# Patient Record
Sex: Female | Born: 1935 | Race: White | Hispanic: No | Marital: Married | Smoking: Former smoker
Health system: Southern US, Community
[De-identification: ages and names within clinical notes are randomized; demographics above are authoritative.]

## PROBLEM LIST (undated history)

## (undated) DIAGNOSIS — M353 Polymyalgia rheumatica: Secondary | ICD-10-CM

## (undated) DIAGNOSIS — I700 Atherosclerosis of aorta: Secondary | ICD-10-CM

## (undated) DIAGNOSIS — Q7649 Other congenital malformations of spine, not associated with scoliosis: Secondary | ICD-10-CM

## (undated) DIAGNOSIS — Z8619 Personal history of other infectious and parasitic diseases: Secondary | ICD-10-CM

## (undated) DIAGNOSIS — I10 Essential (primary) hypertension: Secondary | ICD-10-CM

## (undated) HISTORY — DX: Personal history of other infectious and parasitic diseases: Z86.19

## (undated) HISTORY — DX: Essential (primary) hypertension: I10

## (undated) HISTORY — PX: CARPAL TUNNEL RELEASE: SHX101

## (undated) HISTORY — DX: Atherosclerosis of aorta: I70.0

## (undated) HISTORY — DX: Polymyalgia rheumatica: M35.3

---

## 1988-09-01 HISTORY — PX: BREAST BIOPSY: SHX20

## 2016-09-25 ENCOUNTER — Ambulatory Visit: Payer: Medicare Other | Admitting: Internal Medicine

## 2016-09-30 ENCOUNTER — Ambulatory Visit: Payer: Medicare Other | Admitting: Internal Medicine

## 2016-10-14 ENCOUNTER — Encounter: Payer: Self-pay | Admitting: Internal Medicine

## 2016-10-14 ENCOUNTER — Ambulatory Visit (INDEPENDENT_AMBULATORY_CARE_PROVIDER_SITE_OTHER): Payer: Medicare Other | Admitting: Internal Medicine

## 2016-10-14 VITALS — BP 132/80 | HR 72 | Temp 97.8°F | Ht 62.0 in | Wt 129.8 lb

## 2016-10-14 DIAGNOSIS — M353 Polymyalgia rheumatica: Secondary | ICD-10-CM | POA: Insufficient documentation

## 2016-10-14 DIAGNOSIS — I10 Essential (primary) hypertension: Secondary | ICD-10-CM

## 2016-10-14 MED ORDER — PREDNISONE 2.5 MG PO TABS: 2.5000 mg | ORAL_TABLET | Freq: Every day | ORAL | 1 refills | Status: DC

## 2016-10-14 NOTE — Progress Notes (Signed)
HPI  Pt presents to the clinic today to establish care and for management of the conditions listed below. She is transferring care from Dr. Alfonse Spruce in [place].  HTN: Her BP today is 132/80. She has been on blood pressure medication in the past during a period of grieving, but she has not taken medication recently for this.  PMR: She denies any pain at this time. She takes Prednisone daily. She had her labs checked 08/08/17. She needs referral for a rheumatologist in the area.  Flu: 05/2016 Tetanus: unsure Pneumovax: 2015 Prevnar: 2016 Zostavax: never Pap Smear: < 5 years Mammogram: 06/2016 Bone Density Exam: about 2 years ago Colon Screening: 2012 Vision Screening: annually Dentist: biannually  Past Medical History:  Diagnosis Date  . History of shingles   . Hypertension   . PMR (polymyalgia rheumatica) (HCC)     No current outpatient prescriptions on file.   No current facility-administered medications for this visit.     No Known Allergies  Family History  Problem Relation Age of Onset  . Lung cancer Mother   . Hypertension Mother   . Throat cancer Father     Social History   Social History  . Marital status: Married    Spouse name: N/A  . Number of children: N/A  . Years of education: N/A   Occupational History  . Not on file.   Social History Main Topics  . Smoking status: Never Smoker  . Smokeless tobacco: Never Used  . Alcohol use Yes     Comment: daily--wine  . Drug use: No  . Sexual activity: Not on file   Other Topics Concern  . Not on file   Social History Narrative  . No narrative on file    ROS:  Constitutional: Denies fever, malaise, fatigue, headache or abrupt weight changes.  HEENT: Denies eye pain, eye redness, ear pain, ringing in the ears, wax buildup, runny nose, nasal congestion, bloody nose, or sore throat. Respiratory: Denies difficulty breathing, shortness of breath, cough or sputum production.   Cardiovascular: Denies chest pain,  chest tightness, palpitations or swelling in the hands or feet.  Gastrointestinal: Denies abdominal pain, bloating, constipation, diarrhea or blood in the stool.  GU: Denies frequency, urgency, pain with urination, blood in urine, odor or discharge. Musculoskeletal: Denies decrease in range of motion, difficulty with gait, muscle pain or joint pain and swelling.  Skin: Denies redness, rashes, lesions or ulcercations.  Neurological: Denies dizziness, difficulty with memory, difficulty with speech or problems with balance and coordination.  Psych: Denies anxiety, depression, SI/HI.  No other specific complaints in a complete review of systems (except as listed in HPI above).  PE:  BP 132/80   Pulse 72   Temp 97.8 F (36.6 C) (Oral)   Ht 5\' 2"  (1.575 m)   Wt 129 lb 12 oz (58.9 kg)   SpO2 98%   BMI 23.73 kg/m  Wt Readings from Last 3 Encounters:  10/14/16 129 lb 12 oz (58.9 kg)    General: Appears her stated age, well developed, well nourished in NAD. Cardiovascular: Normal rate and rhythm. S1,S2 noted.  No murmur, rubs or gallops noted.  Pulmonary/Chest: Normal effort and positive vesicular breath sounds. No respiratory distress. No wheezes, rales or ronchi noted.  Musculoskeletal: No signs of joint swelling. No difficulty with gait.  Neurological: Alert and oriented. Psychiatric: Mood and affect normal. Behavior is normal. Judgment and thought content normal.   Assessment and Plan:

## 2016-10-14 NOTE — Assessment & Plan Note (Signed)
Controlled on Prednisone 2.5 mg tablets- refilled today Referral placed to rheumatology- see Rosaria Ferries on the way out to schedule

## 2016-10-14 NOTE — Patient Instructions (Signed)
Polymyalgia Rheumatica Introduction Polymyalgia rheumatica (PMR) is an inflammatory disorder that causes aching and stiffness in your muscles and joints. Sometimes, PMR leads to a more dangerous condition (temporal arteritis or giant cell arteritis), which can cause vision loss. What are the causes? The exact cause of PMR is not known. What increases the risk? This condition is more likely to develop in:  Females.  People who are 80 years of age or older.  Caucasians. What are the signs or symptoms?   Pain and stiffness are the main symptoms of PMR. Symptoms may start slowly or suddenly. The symptoms:  May be worse after inactivity and in the morning.  May affect your:  Hips, buttocks, and thighs.  Neck, arms, and shoulders. This can make it hard to raise your arms above your head.  Hands and wrists. Other symptoms include:  Fever.  Tiredness.  Weakness.  Decreased appetite. This may lead to weight loss. How is this diagnosed? This condition is diagnosed with a medical history and physical exam. You may need to see a health care provider who specializes in diseases of the joint, muscles, and bones (rheumatologist). You may also have tests, including:  Blood tests.  X-rays. How is this treated? PMR usually goes away without treatment, but it may take years for that to happen. In the meantime, your health care provider may recommend low-dose steroids to help manage your symptoms of pain and stiffness. Regular exercise and rest will also help your symptoms. Follow these instructions at home:  Take over-the-counter and prescription medicines only as told by your health care provider.  Make sure to get enough rest and sleep.  Eat a healthy and nutritious diet.  Try to exercise most days of the week. Ask your health care provider what type of exercise is best for you.  Keep all follow-up visits as told by your health are provider. This is important. Contact a health  care provider if:  Your symptoms are not controlled with medicine.  You have side effects from steroids. These may include:  Weight gain.  Swelling.  Insomnia.  Mood changes.  Bruising.  High blood sugar readings, if you have diabetes.  Higher than normal blood pressure readings, if you monitor your blood pressure. Get help right away if:  You develop symptoms of temporal arteritis, such as:  A change in vision.  Severe headache.  Scalp pain.  Jaw pain. This information is not intended to replace advice given to you by your health care provider. Make sure you discuss any questions you have with your health care provider. Document Released: 09/25/2004 Document Revised: 01/24/2016 Document Reviewed: 02/28/2015  2017 Elsevier

## 2016-10-14 NOTE — Assessment & Plan Note (Signed)
Discussed BP goal of 120/80. She feels like her BP today is good, and is not interested in starting any medication for this at this time Will monitor

## 2016-10-22 DIAGNOSIS — M8580 Other specified disorders of bone density and structure, unspecified site: Secondary | ICD-10-CM | POA: Insufficient documentation

## 2016-10-22 DIAGNOSIS — M353 Polymyalgia rheumatica: Secondary | ICD-10-CM | POA: Diagnosis not present

## 2016-10-22 DIAGNOSIS — M8588 Other specified disorders of bone density and structure, other site: Secondary | ICD-10-CM | POA: Diagnosis not present

## 2016-10-22 DIAGNOSIS — Z7952 Long term (current) use of systemic steroids: Secondary | ICD-10-CM | POA: Diagnosis not present

## 2016-10-22 DIAGNOSIS — I10 Essential (primary) hypertension: Secondary | ICD-10-CM | POA: Insufficient documentation

## 2016-12-09 ENCOUNTER — Ambulatory Visit (INDEPENDENT_AMBULATORY_CARE_PROVIDER_SITE_OTHER): Payer: Medicare Other | Admitting: Internal Medicine

## 2016-12-09 ENCOUNTER — Encounter: Payer: Self-pay | Admitting: Internal Medicine

## 2016-12-09 VITALS — BP 132/76 | HR 79 | Temp 97.9°F | Wt 134.5 lb

## 2016-12-09 DIAGNOSIS — J301 Allergic rhinitis due to pollen: Secondary | ICD-10-CM | POA: Diagnosis not present

## 2016-12-09 NOTE — Progress Notes (Signed)
HPI  Pt presents to the clinic today with c/o nasal congestion and cough. This started 1 week ago. She is not able to blow much out of her nose. The cough is productive at times of yellow/clear mucous. She denies ear pain, runny nose or sore throat. She denies fever, chills or body aches. She has tried Robitussin and Administrator with minimal relief. She has no history of seasonal allergies. She has not had sick contacts.  Review of Systems     Past Medical History:  Diagnosis Date  . History of shingles   . Hypertension   . PMR (polymyalgia rheumatica) (HCC)     Family History  Problem Relation Age of Onset  . Lung cancer Mother   . Hypertension Mother   . Throat cancer Father   . Diabetes Neg Hx   . Stroke Neg Hx     Social History   Social History  . Marital status: Married    Spouse name: N/A  . Number of children: N/A  . Years of education: N/A   Occupational History  . Not on file.   Social History Main Topics  . Smoking status: Former Research scientist (life sciences)  . Smokeless tobacco: Never Used  . Alcohol use Yes     Comment: daily--wine  . Drug use: No  . Sexual activity: Yes   Other Topics Concern  . Not on file   Social History Narrative  . No narrative on file    No Known Allergies   Constitutional:  Denies headache, fatigue, fever or abrupt weight changes.  HEENT:  Positive nasal congestion. Denies eye redness, ear pain, ringing in the ears, wax buildup, runny nose or sore throat. Respiratory: Positive cough. Denies difficulty breathing or shortness of breath.  Cardiovascular: Denies chest pain, chest tightness, palpitations or swelling in the hands or feet.   No other specific complaints in a complete review of systems (except as listed in HPI above).  Objective:   BP 132/76   Pulse 79   Temp 97.9 F (36.6 C) (Oral)   Wt 134 lb 8 oz (61 kg)   SpO2 98%   BMI 24.60 kg/m   General: Appears her stated age, well developed, well nourished in NAD. HEENT: Head:  normal shape and size, no sinus tenderness noted; Ears: Tm's gray and intact, normal light reflex; Nose: mucosa boggy and moist, turbinates swollen L>R, septum midline; Throat/Mouth: + PND. Teeth present, mucosa pink and moist, no exudate noted, no lesions or ulcerations noted.  Neck:  No adenopathy noted.  Pulmonary/Chest: Normal effort and positive vesicular breath sounds. No respiratory distress. No wheezes, rales or ronchi noted.       Assessment & Plan:   Allergic Rhinitis:  Start Zyrtec OTC Flonase 2 sprays each nostril for 3 days and then as needed.   RTC as needed or if symptoms persist. Webb Silversmith, NP

## 2016-12-09 NOTE — Patient Instructions (Signed)
Allergic Rhinitis Allergic rhinitis is when the mucous membranes in the nose respond to allergens. Allergens are particles in the air that cause your body to have an allergic reaction. This causes you to release allergic antibodies. Through a chain of events, these eventually cause you to release histamine into the blood stream. Although meant to protect the body, it is this release of histamine that causes your discomfort, such as frequent sneezing, congestion, and an itchy, runny nose. What are the causes? Seasonal allergic rhinitis (hay fever) is caused by pollen allergens that may come from grasses, trees, and weeds. Year-round allergic rhinitis (perennial allergic rhinitis) is caused by allergens such as house dust mites, pet dander, and mold spores. What are the signs or symptoms?  Nasal stuffiness (congestion).  Itchy, runny nose with sneezing and tearing of the eyes. How is this diagnosed? Your health care provider can help you determine the allergen or allergens that trigger your symptoms. If you and your health care provider are unable to determine the allergen, skin or blood testing may be used. Your health care provider will diagnose your condition after taking your health history and performing a physical exam. Your health care provider may assess you for other related conditions, such as asthma, pink eye, or an ear infection. How is this treated? Allergic rhinitis does not have a cure, but it can be controlled by:  Medicines that block allergy symptoms. These may include allergy shots, nasal sprays, and oral antihistamines.  Avoiding the allergen. Hay fever may often be treated with antihistamines in pill or nasal spray forms. Antihistamines block the effects of histamine. There are over-the-counter medicines that may help with nasal congestion and swelling around the eyes. Check with your health care provider before taking or giving this medicine. If avoiding the allergen or the  medicine prescribed do not work, there are many new medicines your health care provider can prescribe. Stronger medicine may be used if initial measures are ineffective. Desensitizing injections can be used if medicine and avoidance does not work. Desensitization is when a patient is given ongoing shots until the body becomes less sensitive to the allergen. Make sure you follow up with your health care provider if problems continue. Follow these instructions at home: It is not possible to completely avoid allergens, but you can reduce your symptoms by taking steps to limit your exposure to them. It helps to know exactly what you are allergic to so that you can avoid your specific triggers. Contact a health care provider if:  You have a fever.  You develop a cough that does not stop easily (persistent).  You have shortness of breath.  You start wheezing.  Symptoms interfere with normal daily activities. This information is not intended to replace advice given to you by your health care provider. Make sure you discuss any questions you have with your health care provider. Document Released: 05/13/2001 Document Revised: 04/18/2016 Document Reviewed: 04/25/2013 Elsevier Interactive Patient Education  2017 Elsevier Inc.  

## 2017-01-15 DIAGNOSIS — M353 Polymyalgia rheumatica: Secondary | ICD-10-CM | POA: Diagnosis not present

## 2017-01-28 DIAGNOSIS — Z7952 Long term (current) use of systemic steroids: Secondary | ICD-10-CM | POA: Diagnosis not present

## 2017-01-28 DIAGNOSIS — M353 Polymyalgia rheumatica: Secondary | ICD-10-CM | POA: Diagnosis not present

## 2017-01-28 DIAGNOSIS — M8580 Other specified disorders of bone density and structure, unspecified site: Secondary | ICD-10-CM | POA: Diagnosis not present

## 2017-03-26 ENCOUNTER — Ambulatory Visit: Payer: Medicare Other | Admitting: Internal Medicine

## 2017-03-31 DIAGNOSIS — M8588 Other specified disorders of bone density and structure, other site: Secondary | ICD-10-CM | POA: Diagnosis not present

## 2017-04-30 DIAGNOSIS — Z7952 Long term (current) use of systemic steroids: Secondary | ICD-10-CM | POA: Diagnosis not present

## 2017-04-30 DIAGNOSIS — M8580 Other specified disorders of bone density and structure, unspecified site: Secondary | ICD-10-CM | POA: Diagnosis not present

## 2017-04-30 DIAGNOSIS — M353 Polymyalgia rheumatica: Secondary | ICD-10-CM | POA: Diagnosis not present

## 2017-10-29 DIAGNOSIS — M8580 Other specified disorders of bone density and structure, unspecified site: Secondary | ICD-10-CM | POA: Diagnosis not present

## 2017-10-29 DIAGNOSIS — M353 Polymyalgia rheumatica: Secondary | ICD-10-CM | POA: Diagnosis not present

## 2018-01-04 DIAGNOSIS — M8580 Other specified disorders of bone density and structure, unspecified site: Secondary | ICD-10-CM | POA: Diagnosis not present

## 2018-01-04 DIAGNOSIS — M791 Myalgia, unspecified site: Secondary | ICD-10-CM | POA: Diagnosis not present

## 2018-01-06 ENCOUNTER — Encounter: Payer: Self-pay | Admitting: Internal Medicine

## 2018-01-06 ENCOUNTER — Ambulatory Visit (INDEPENDENT_AMBULATORY_CARE_PROVIDER_SITE_OTHER): Payer: Medicare HMO | Admitting: Internal Medicine

## 2018-01-06 VITALS — BP 128/80 | HR 81 | Temp 97.9°F | Ht 61.66 in | Wt 135.0 lb

## 2018-01-06 DIAGNOSIS — I10 Essential (primary) hypertension: Secondary | ICD-10-CM

## 2018-01-06 DIAGNOSIS — Z0000 Encounter for general adult medical examination without abnormal findings: Secondary | ICD-10-CM

## 2018-01-06 DIAGNOSIS — M353 Polymyalgia rheumatica: Secondary | ICD-10-CM

## 2018-01-06 NOTE — Assessment & Plan Note (Signed)
Controlled off meds [facility] today Reinforced DASH diet

## 2018-01-06 NOTE — Assessment & Plan Note (Signed)
Working with rheumatology on workup Continue Prednisone per rheumatology

## 2018-01-06 NOTE — Patient Instructions (Signed)
Health Maintenance for Postmenopausal Women Menopause is a normal process in which your reproductive ability comes to an end. This process happens gradually over a span of months to years, usually between the ages of 22 and 9. Menopause is complete when you have missed 12 consecutive menstrual periods. It is important to talk with your health care provider about some of the most common conditions that affect postmenopausal women, such as heart disease, cancer, and bone loss (osteoporosis). Adopting a healthy lifestyle and getting preventive care can help to promote your health and wellness. Those actions can also lower your chances of developing some of these common conditions. What should I know about menopause? During menopause, you may experience a number of symptoms, such as:  Moderate-to-severe hot flashes.  Night sweats.  Decrease in sex drive.  Mood swings.  Headaches.  Tiredness.  Irritability.  Memory problems.  Insomnia.  Choosing to treat or not to treat menopausal changes is an individual decision that you make with your health care provider. What should I know about hormone replacement therapy and supplements? Hormone therapy products are effective for treating symptoms that are associated with menopause, such as hot flashes and night sweats. Hormone replacement carries certain risks, especially as you become older. If you are thinking about using estrogen or estrogen with progestin treatments, discuss the benefits and risks with your health care provider. What should I know about heart disease and stroke? Heart disease, heart attack, and stroke become more likely as you age. This may be due, in part, to the hormonal changes that your body experiences during menopause. These can affect how your body processes dietary fats, triglycerides, and cholesterol. Heart attack and stroke are both medical emergencies. There are many things that you can do to help prevent heart disease  and stroke:  Have your blood pressure checked at least every 1-2 years. High blood pressure causes heart disease and increases the risk of stroke.  If you are 53-22 years old, ask your health care provider if you should take aspirin to prevent a heart attack or a stroke.  Do not use any tobacco products, including cigarettes, chewing tobacco, or electronic cigarettes. If you need help quitting, ask your health care provider.  It is important to eat a healthy diet and maintain a healthy weight. ? Be sure to include plenty of vegetables, fruits, low-fat dairy products, and lean protein. ? Avoid eating foods that are high in solid fats, added sugars, or salt (sodium).  Get regular exercise. This is one of the most important things that you can do for your health. ? Try to exercise for at least 150 minutes each week. The type of exercise that you do should increase your heart rate and make you sweat. This is known as moderate-intensity exercise. ? Try to do strengthening exercises at least twice each week. Do these in addition to the moderate-intensity exercise.  Know your numbers.Ask your health care provider to check your cholesterol and your blood glucose. Continue to have your blood tested as directed by your health care provider.  What should I know about cancer screening? There are several types of cancer. Take the following steps to reduce your risk and to catch any cancer development as early as possible. Breast Cancer  Practice breast self-awareness. ? This means understanding how your breasts normally appear and feel. ? It also means doing regular breast self-exams. Let your health care provider know about any changes, no matter how small.  If you are 40  or older, have a clinician do a breast exam (clinical breast exam or CBE) every year. Depending on your age, family history, and medical history, it may be recommended that you also have a yearly breast X-ray (mammogram).  If you  have a family history of breast cancer, talk with your health care provider about genetic screening.  If you are at high risk for breast cancer, talk with your health care provider about having an MRI and a mammogram every year.  Breast cancer (BRCA) gene test is recommended for women who have family members with BRCA-related cancers. Results of the assessment will determine the need for genetic counseling and BRCA1 and for BRCA2 testing. BRCA-related cancers include these types: ? Breast. This occurs in males or females. ? Ovarian. ? Tubal. This may also be called fallopian tube cancer. ? Cancer of the abdominal or pelvic lining (peritoneal cancer). ? Prostate. ? Pancreatic.  Cervical, Uterine, and Ovarian Cancer Your health care provider may recommend that you be screened regularly for cancer of the pelvic organs. These include your ovaries, uterus, and vagina. This screening involves a pelvic exam, which includes checking for microscopic changes to the surface of your cervix (Pap test).  For women ages 21-65, health care providers may recommend a pelvic exam and a Pap test every three years. For women ages 79-65, they may recommend the Pap test and pelvic exam, combined with testing for human papilloma virus (HPV), every five years. Some types of HPV increase your risk of cervical cancer. Testing for HPV may also be done on women of any age who have unclear Pap test results.  Other health care providers may not recommend any screening for nonpregnant women who are considered low risk for pelvic cancer and have no symptoms. Ask your health care provider if a screening pelvic exam is right for you.  If you have had past treatment for cervical cancer or a condition that could lead to cancer, you need Pap tests and screening for cancer for at least 20 years after your treatment. If Pap tests have been discontinued for you, your risk factors (such as having a new sexual partner) need to be  reassessed to determine if you should start having screenings again. Some women have medical problems that increase the chance of getting cervical cancer. In these cases, your health care provider may recommend that you have screening and Pap tests more often.  If you have a family history of uterine cancer or ovarian cancer, talk with your health care provider about genetic screening.  If you have vaginal bleeding after reaching menopause, tell your health care provider.  There are currently no reliable tests available to screen for ovarian cancer.  Lung Cancer Lung cancer screening is recommended for adults 69-62 years old who are at high risk for lung cancer because of a history of smoking. A yearly low-dose CT scan of the lungs is recommended if you:  Currently smoke.  Have a history of at least 30 pack-years of smoking and you currently smoke or have quit within the past 15 years. A pack-year is smoking an average of one pack of cigarettes per day for one year.  Yearly screening should:  Continue until it has been 15 years since you quit.  Stop if you develop a health problem that would prevent you from having lung cancer treatment.  Colorectal Cancer  This type of cancer can be detected and can often be prevented.  Routine colorectal cancer screening usually begins at  age 42 and continues through age 45.  If you have risk factors for colon cancer, your health care provider may recommend that you be screened at an earlier age.  If you have a family history of colorectal cancer, talk with your health care provider about genetic screening.  Your health care provider may also recommend using home test kits to check for hidden blood in your stool.  A small camera at the end of a tube can be used to examine your colon directly (sigmoidoscopy or colonoscopy). This is done to check for the earliest forms of colorectal cancer.  Direct examination of the colon should be repeated every  5-10 years until age 71. However, if early forms of precancerous polyps or small growths are found or if you have a family history or genetic risk for colorectal cancer, you may need to be screened more often.  Skin Cancer  Check your skin from head to toe regularly.  Monitor any moles. Be sure to tell your health care provider: ? About any new moles or changes in moles, especially if there is a change in a mole's shape or color. ? If you have a mole that is larger than the size of a pencil eraser.  If any of your family members has a history of skin cancer, especially at a young age, talk with your health care provider about genetic screening.  Always use sunscreen. Apply sunscreen liberally and repeatedly throughout the day.  Whenever you are outside, protect yourself by wearing long sleeves, pants, a wide-brimmed hat, and sunglasses.  What should I know about osteoporosis? Osteoporosis is a condition in which bone destruction happens more quickly than new bone creation. After menopause, you may be at an increased risk for osteoporosis. To help prevent osteoporosis or the bone fractures that can happen because of osteoporosis, the following is recommended:  If you are 46-71 years old, get at least 1,000 mg of calcium and at least 600 mg of vitamin D per day.  If you are older than age 55 but younger than age 65, get at least 1,200 mg of calcium and at least 600 mg of vitamin D per day.  If you are older than age 54, get at least 1,200 mg of calcium and at least 800 mg of vitamin D per day.  Smoking and excessive alcohol intake increase the risk of osteoporosis. Eat foods that are rich in calcium and vitamin D, and do weight-bearing exercises several times each week as directed by your health care provider. What should I know about how menopause affects my mental health? Depression may occur at any age, but it is more common as you become older. Common symptoms of depression  include:  Low or sad mood.  Changes in sleep patterns.  Changes in appetite or eating patterns.  Feeling an overall lack of motivation or enjoyment of activities that you previously enjoyed.  Frequent crying spells.  Talk with your health care provider if you think that you are experiencing depression. What should I know about immunizations? It is important that you get and maintain your immunizations. These include:  Tetanus, diphtheria, and pertussis (Tdap) booster vaccine.  Influenza every year before the flu season begins.  Pneumonia vaccine.  Shingles vaccine.  Your health care provider may also recommend other immunizations. This information is not intended to replace advice given to you by your health care provider. Make sure you discuss any questions you have with your health care provider. Document Released: 10/10/2005  Document Revised: 03/07/2016 Document Reviewed: 05/22/2015 Elsevier Interactive Patient Education  2018 [employer] Inc.  

## 2018-01-06 NOTE — Progress Notes (Signed)
HPI:  Pt presents to the clinic today for her Medicare Wellness Exam.   HTN: Her BP today 128/80. She is not taking any antihypertensive medication at this time. There is no ECG on file.  PMR: Pain and inflammation controlled on daily Prednisone.   Past Medical History:  Diagnosis Date  . History of shingles   . Hypertension   . PMR (polymyalgia rheumatica) (HCC)     Current Outpatient Medications  Medication Sig Dispense Refill  . predniSONE (DELTASONE) 1 MG tablet Take 2 tabs daily for 90 days     No current facility-administered medications for this visit.     No Known Allergies  Family History  Problem Relation Age of Onset  . Lung cancer Mother   . Hypertension Mother   . Throat cancer Father   . Diabetes Neg Hx   . Stroke Neg Hx     Social History   Socioeconomic History  . Marital status: Married    Spouse name: Not on file  . Number of children: Not on file  . Years of education: Not on file  . Highest education level: Not on file  Occupational History  . Not on file  Social Needs  . Financial resource strain: Not on file  . Food insecurity:    Worry: Not on file    Inability: Not on file  . Transportation needs:    Medical: Not on file    Non-medical: Not on file  Tobacco Use  . Smoking status: Former Research scientist (life sciences)  . Smokeless tobacco: Never Used  Substance and Sexual Activity  . Alcohol use: Yes    Comment: daily--wine  . Drug use: No  . Sexual activity: Yes  Lifestyle  . Physical activity:    Days per week: Not on file    Minutes per session: Not on file  . Stress: Not on file  Relationships  . Social connections:    Talks on phone: Not on file    Gets together: Not on file    Attends religious service: Not on file    Active member of club or organization: Not on file    Attends meetings of clubs or organizations: Not on file    Relationship status: Not on file  . Intimate partner violence:    Fear of current or ex partner: Not on file     Emotionally abused: Not on file    Physically abused: Not on file    Forced sexual activity: Not on file  Other Topics Concern  . Not on file  Social History Narrative  . Not on file    Hospitiliaztions: None  Health Maintenance:    Flu: 07/2017  Tetanus: unsure  Pneumovax: 2015  Prevnar: 2016  Zostavax: never  Mammogram: 06/2016  Pap Smear: no longer screening  Bone Density: 2018  Colon Screening: 2012  Eye Doctor: as needed  Dental Exam: as needed   Providers:   PCP: Webb Silversmith, NP-C  Rheumalogist: Dr. Raj Janus    I have personally reviewed and have noted:  1. The patient's medical and social history 2. Their use of alcohol, tobacco or illicit drugs 3. Their current medications and supplements 4. The patient's functional ability including ADL's, fall risks, home safety risks and hearing or visual impairment. 5. Diet and physical activities 6. Evidence for depression or mood disorder  Subjective:   Review of Systems:   Constitutional: Denies fever, malaise, fatigue, headache or abrupt weight changes.  HEENT: Denies eye pain, eye redness,  ear pain, ringing in the ears, wax buildup, runny nose, nasal congestion, bloody nose, or sore throat. Respiratory: Denies difficulty breathing, shortness of breath, cough or sputum production.   Cardiovascular: Denies chest pain, chest tightness, palpitations or swelling in the hands or feet.  Gastrointestinal: Denies abdominal pain, bloating, constipation, diarrhea or blood in the stool.  GU: Denies urgency, frequency, pain with urination, burning sensation, blood in urine, odor or discharge. Musculoskeletal: Pt reports joint pain. Denies decrease in range of motion, difficulty with gait, muscle pain or joint swelling.  Skin: Pt reports itchiness to LLE. Denies lesions or ulcercations.  Neurological: Denies dizziness, difficulty with memory, difficulty with speech or problems with balance and coordination.  Psych: Denies  anxiety, depression, SI/HI.  No other specific complaints in a complete review of systems (except as listed in HPI above).  Objective:  PE:   BP 128/80   Pulse 81   Temp 97.9 F (36.6 C) (Oral)   Ht 5' 1.66" (1.566 m)   Wt 135 lb (61.2 kg)   SpO2 98%   BMI 24.96 kg/m   Wt Readings from Last 3 Encounters:  12/09/16 134 lb 8 oz (61 kg)  10/14/16 129 lb 12 oz (58.9 kg)    General: Appears her stated age, well developed, well nourished in NAD. Skin: Warm, dry and intact. Hypopigmentation noted over left lateral malleolus. No rashes, lesions or ulcerations noted. HEENT: Head: normal shape and size; Eyes: sclera white, no icterus, conjunctiva pink, PERRLA and EOMs intact; Ears: Tm's gray and intact, normal light reflex; Throat/Mouth: Teeth present, mucosa pink and moist, no exudate, lesions or ulcerations noted.  Neck: Neck supple, trachea midline. No masses, lumps or thyromegaly present.  Cardiovascular: Normal rate and rhythm. S1,S2 noted.  No murmur, rubs or gallops noted. No JVD or BLE edema. No carotid bruits noted. Pulmonary/Chest: Normal effort and positive vesicular breath sounds. No respiratory distress. No wheezes, rales or ronchi noted.  Abdomen: Soft and nontender. Normal bowel sounds. No distention or masses noted. Liver, spleen and kidneys non palpable. Musculoskeletal:  Strength 5/5 BUE/BLE. No signs of joint swelling.  Neurological: Alert and oriented. Cranial nerves II-XII grossly intact. Coordination normal.  Psychiatric: Mood and affect normal. Behavior is normal. Judgment and thought content normal.      Assessment and Plan:   Medicare Annual Wellness Visit:  Diet: She does eat meat. She consumes fruits and veggies daily. She does eat some fried foods. She drinks mostly water, wine. Physical activity: walking Depression/mood screen: Negative Hearing: Intact to whispered voice Visual acuity: Grossly normal ADLs: Capable Fall risk: None Home safety:  Good Cognitive evaluation: Intact to orientation, naming, recall and repetition EOL planning: Adv directives, full code/ I agree  Preventative Medicine: Encouraged her to get a flu shot in the fall. She declines tetanus vaccine as [employer] will not pay for. Pneumovax, Prevnar UTD. She declines Zostovax. She declines pap smear, mammogram, colon screening or bone density exam. Encouraged him to consume a balanced diet and exercise regimen. Advised her to see an eye doctor and dentist annually. Will check CMET and Lipid profile. CBC, Vit D from rheumatology reviewed.   Next appointment: 1 year, Medicare Wellness Exam   Webb Silversmith, NP

## 2018-01-08 ENCOUNTER — Telehealth: Payer: Self-pay

## 2018-01-08 DIAGNOSIS — Z0000 Encounter for general adult medical examination without abnormal findings: Secondary | ICD-10-CM | POA: Diagnosis not present

## 2018-01-08 DIAGNOSIS — E785 Hyperlipidemia, unspecified: Secondary | ICD-10-CM | POA: Diagnosis not present

## 2018-01-08 NOTE — Telephone Encounter (Signed)
Labs reviewed, waiting on lipid profile

## 2018-01-08 NOTE — Telephone Encounter (Signed)
Copied from [place] [id number]. Topic: General - Call Back - No Documentation >> Jan 08, 2018 11:45 AM Valla Leaver wrote: Reason for CRM: Patient needs her labs from Dr. Meda Coffee pulled by NP [facility]. She needs the comprehensive metabolic panel ordered, cancelledc from the labs drawn this morning because Dr. Meda Coffee already did this test at [facility] on or around 05/05. She was tryign to reach Selinda Orion to let her know to cancel the cmp order.

## 2018-01-08 NOTE — Telephone Encounter (Signed)
[name] in lab said already taken care of and results are on Lorraine Echevaria NP desk.

## 2018-01-09 LAB — LIPID PANEL
CHOL/HDL RATIO: 3.7 ratio (ref 0.0–4.4)
Cholesterol, Total: 232 mg/dL — ABNORMAL HIGH (ref 100–199)
HDL: 62 mg/dL (ref >39)
LDL Calculated: 140 mg/dL — ABNORMAL HIGH (ref 0–99)
Triglycerides: 149 mg/dL (ref 0–149)
VLDL CHOLESTEROL CAL: 30 mg/dL (ref 5–40)

## 2018-01-18 DIAGNOSIS — M791 Myalgia, unspecified site: Secondary | ICD-10-CM | POA: Diagnosis not present

## 2018-01-18 DIAGNOSIS — M8580 Other specified disorders of bone density and structure, unspecified site: Secondary | ICD-10-CM | POA: Diagnosis not present

## 2018-01-18 DIAGNOSIS — M7552 Bursitis of left shoulder: Secondary | ICD-10-CM | POA: Diagnosis not present

## 2018-04-05 DIAGNOSIS — M8588 Other specified disorders of bone density and structure, other site: Secondary | ICD-10-CM | POA: Diagnosis not present

## 2018-05-07 ENCOUNTER — Emergency Department: Payer: Medicare HMO

## 2018-05-07 ENCOUNTER — Ambulatory Visit (INDEPENDENT_AMBULATORY_CARE_PROVIDER_SITE_OTHER): Payer: Medicare HMO | Admitting: Family

## 2018-05-07 ENCOUNTER — Other Ambulatory Visit: Payer: Self-pay

## 2018-05-07 ENCOUNTER — Encounter: Payer: Self-pay | Admitting: Emergency Medicine

## 2018-05-07 ENCOUNTER — Ambulatory Visit: Payer: Self-pay | Admitting: *Deleted

## 2018-05-07 ENCOUNTER — Encounter: Payer: Self-pay | Admitting: Family

## 2018-05-07 ENCOUNTER — Emergency Department
Admission: EM | Admit: 2018-05-07 | Discharge: 2018-05-07 | Disposition: A | Discharge status: Home / Self Care | Payer: Medicare HMO | Attending: Emergency Medicine | Admitting: Emergency Medicine

## 2018-05-07 VITALS — BP 140/80 | HR 78 | Temp 97.5°F | Resp 16 | Wt 125.5 lb

## 2018-05-07 DIAGNOSIS — R269 Unspecified abnormalities of gait and mobility: Secondary | ICD-10-CM | POA: Diagnosis not present

## 2018-05-07 DIAGNOSIS — R202 Paresthesia of skin: Secondary | ICD-10-CM | POA: Diagnosis not present

## 2018-05-07 DIAGNOSIS — M353 Polymyalgia rheumatica: Secondary | ICD-10-CM | POA: Diagnosis not present

## 2018-05-07 DIAGNOSIS — R270 Ataxia, unspecified: Secondary | ICD-10-CM

## 2018-05-07 DIAGNOSIS — Z87891 Personal history of nicotine dependence: Secondary | ICD-10-CM | POA: Insufficient documentation

## 2018-05-07 DIAGNOSIS — I10 Essential (primary) hypertension: Secondary | ICD-10-CM | POA: Diagnosis not present

## 2018-05-07 DIAGNOSIS — R2681 Unsteadiness on feet: Secondary | ICD-10-CM | POA: Diagnosis present

## 2018-05-07 DIAGNOSIS — R200 Anesthesia of skin: Secondary | ICD-10-CM

## 2018-05-07 DIAGNOSIS — Z79899 Other long term (current) drug therapy: Secondary | ICD-10-CM | POA: Diagnosis not present

## 2018-05-07 LAB — BASIC METABOLIC PANEL
Anion gap: 9 (ref 5–15)
BUN: 19 mg/dL (ref 8–23)
CO2: 28 mmol/L (ref 22–32)
Calcium: 8.8 mg/dL — ABNORMAL LOW (ref 8.9–10.3)
Chloride: 104 mmol/L (ref 98–111)
Creatinine, Ser: 0.99 mg/dL (ref 0.44–1.00)
GFR calc Af Amer: >60 mL/min (ref >60)
GFR calc non Af Amer: 52 mL/min — ABNORMAL LOW (ref >60)
Glucose, Bld: 89 mg/dL (ref 70–99)
Potassium: 3.7 mmol/L (ref 3.5–5.1)
Sodium: 141 mmol/L (ref 135–145)

## 2018-05-07 LAB — URINALYSIS, COMPLETE (UACMP) WITH MICROSCOPIC
Bacteria, UA: NONE SEEN
Bilirubin Urine: NEGATIVE
GLUCOSE, UA: NEGATIVE mg/dL
KETONES UR: NEGATIVE mg/dL
Nitrite: NEGATIVE
Protein, ur: NEGATIVE mg/dL
SQUAMOUS EPITHELIAL / LPF: NONE SEEN (ref 0–5)
Specific Gravity, Urine: 1.003 — ABNORMAL LOW (ref 1.005–1.030)
pH: 7 (ref 5.0–8.0)

## 2018-05-07 LAB — CBC
HCT: 45.1 % (ref 35.0–47.0)
Hemoglobin: 15.4 g/dL (ref 12.0–16.0)
MCH: 33 pg (ref 26.0–34.0)
MCHC: 34 g/dL (ref 32.0–36.0)
MCV: 96.9 fL (ref 80.0–100.0)
Platelets: 292 10*3/uL (ref 150–440)
RBC: 4.66 MIL/uL (ref 3.80–5.20)
RDW: 13.4 % (ref 11.5–14.5)
WBC: 9.7 10*3/uL (ref 3.6–11.0)

## 2018-05-07 LAB — TROPONIN I: Troponin I: <0.03 ng/mL (ref <0.03)

## 2018-05-07 MED ORDER — ASPIRIN 81 MG PO CHEW
324.0000 mg | CHEWABLE_TABLET | Freq: Once | ORAL | Status: AC
  Administered 2018-05-07: 324 mg via ORAL
  Filled 2018-05-07: qty 4

## 2018-05-07 NOTE — ED Triage Notes (Signed)
Pt reports feeling "off balance" x 1 week. Went to PCP this morning. Did not do blood work. PCP "felt uncomfortable" because she wasn't sure why pt was experiencing this off-balance feeling.  Also c/o tingling in right arm for several days and bilateral lower legs. Tingling in lower legs as dissipated over the day. Also c/o stabbing pain under right breast. Intermittent.

## 2018-05-07 NOTE — Progress Notes (Signed)
Subjective:    Patient ID: Lorraine Sims, female    DOB: [birth year], 83 y.o.   MRN: [id number]  CC: Lorraine Sims is a 83 y.o. female who presents today for an acute visit.    HPI: Multiple complaints  CC: 'equilibrium is off' x 5 days, constant.  Feels like weaving and needing to hold on to something. 'almost like drunk'  Room is not spinning. No ear pain, congestion, tinnitus.  Thinks vision is off due to vision is off slightly, 'need to get eyes checked and has been very subtle.' No sudden loss of vision, congestion.   No falls.   Tingling in right thumb x 5 days ago, constant. No aggravating , relieving factors.  H/o PMR.   Right handed. No repetitive motions. H/o BL CTS and surgery. These feels different than CTS as that was painful. This is not painful. No neck pain.   tingling in right legs x 5 days, resolved.   Also complains of 'twinge' on RUQ on way over to office visit. Catching feeling. No abdominal surgeries. No rash. Hasnt recurred   Has been taking Lobbyist as thought she was getting a cold.       HISTORY:  Past Medical History:  Diagnosis Date  . History of shingles   . Hypertension   . PMR (polymyalgia rheumatica) (HCC)    Past Surgical History:  Procedure Laterality Date  . BREAST BIOPSY Right 1990  . CARPAL TUNNEL RELEASE Bilateral    Family History  Problem Relation Age of Onset  . Lung cancer Mother   . Hypertension Mother   . Throat cancer Father   . Diabetes Neg Hx   . Stroke Neg Hx     Allergies: Patient has no known allergies. Current Outpatient Medications on File Prior to Visit  Medication Sig Dispense Refill  . calcium-vitamin D (OSCAL WITH D) 500-200 MG-UNIT tablet Take 1 tablet by mouth 2 (two) times daily.    . Potassium 99 MG TABS Take by mouth daily.    . cyclobenzaprine (FLEXERIL) 5 MG tablet 1 tab PM Begin only at night time, as may cause drowsiness.     No current facility-administered medications on file prior to  visit.     Social History   Tobacco Use  . Smoking status: Former Research scientist (life sciences)  . Smokeless tobacco: Never Used  Substance Use Topics  . Alcohol use: Yes    Comment: daily--wine  . Drug use: No    Review of Systems  Constitutional: Negative for chills and fever.  Eyes: Positive for visual disturbance ('off slightly').  Respiratory: Negative for cough.   Cardiovascular: Negative for chest pain and palpitations.  Gastrointestinal: Negative for nausea and vomiting.  Neurological: Positive for dizziness and numbness. Negative for facial asymmetry and headaches.      Objective:    BP 140/80 (BP Location: Left Arm, Patient Position: Sitting, Cuff Size: Normal)   Pulse 78   Temp (!) 97.5 F (36.4 C) (Oral)   Resp 16   Wt 125 lb 8 oz (56.9 kg)   SpO2 99%   BMI 23.21 kg/m    Physical Exam  Constitutional: She appears well-developed and well-nourished.  HENT:  Nose: Right sinus exhibits no maxillary sinus tenderness and no frontal sinus tenderness. Left sinus exhibits no maxillary sinus tenderness and no frontal sinus tenderness.  Mouth/Throat: Uvula is midline, oropharynx is clear and moist and mucous membranes are normal.  Eyes: Pupils are equal, round, and reactive to light. Conjunctivae and  EOM are normal.  Fundus normal bilaterally.   Cardiovascular: Normal rate, regular rhythm, normal heart sounds and normal pulses.  Pulmonary/Chest: Effort normal and breath sounds normal. She has no wheezes. She has no rhonchi. She has no rales.  Abdominal: Soft. Normal appearance and bowel sounds are normal. She exhibits no distension, no fluid wave, no ascites and no mass. There is no tenderness. There is no rigidity, no rebound, no guarding and no CVA tenderness.  Neurological: She is alert. She has normal strength. No cranial nerve deficit or sensory deficit. She displays a negative Romberg sign.  Reflex Scores:      Bicep reflexes are 2+ on the right side and 2+ on the left side.       Patellar reflexes are 2+ on the right side and 2+ on the left side. Grip equal and strong bilateral upper extremities. Unsteady gait. Poor balance with heel to toe.   Able to perform rapid alternating movements and finger to nose without difficultly.   Negative tinels, phalens of right wrist. Grip strength normal.  Sensation intact BLE.   Skin: Skin is warm and dry.  Psychiatric: She has a normal mood and affect. Her speech is normal and behavior is normal. Thought content normal.  Vitals reviewed.      Assessment & Plan:   1. Gait disturbance Quite concerned by  patient's neurologic exam.  Her gait appeared unsteady,  ataxic as she did heel toe across the room.  This is an abnormal, sudden change for her over the past 5 days.  Based on her age, presentation, sudden change from usual state of health, I am concerned the patient may have experienced a TIA, or CVA.  Had a long discussion with patient and husband in room in which I strongly advised an emergency room evaluation.  After long discussion, husband agreed.  Patient and husband declined transport by emergency vehicle today.  Advised him to drive straight to [facility] emergency room.  They stated they would do this.  CMA has given report to triage nurse in ED.  2. Numbness and tingling in right hand Working diagnosis of carpal tunnel , although negative Phalen's, Tinel's today.  Patient has carpal tunnel history.  Advised to follow-up this with her PCP as prior primary concern today was her gait disturbance.  Patient verbalized understanding of this    I have discontinued Lorraine Sims's predniSONE. I am also having her maintain her cyclobenzaprine, Potassium, and calcium-vitamin D.   No orders of the defined types were placed in this encounter.   Return precautions given.   Risks, benefits, and alternatives of the medications and treatment plan prescribed today were discussed, and patient expressed understanding.   Education  regarding symptom management and diagnosis given to patient on AVS.  Continue to follow with Lorraine Fenton, NP for routine health maintenance.   Lorraine Sims and I agreed with plan.   Mable Paris, FNP

## 2018-05-07 NOTE — Discharge Instructions (Addendum)
Your CT scan and MRI of the brain were okay today.  They did not show signs of stroke or other causes for your difficulty walking. Please follow up with your doctor this week if your symptoms do not resolve for additional evaluation of these symptoms.

## 2018-05-07 NOTE — ED Notes (Signed)
Pt to MRI

## 2018-05-07 NOTE — ED Provider Notes (Signed)
[facility] Emergency Department Provider Note  ____________________________________________  Time seen: Approximately 5:18 PM  I have reviewed the triage vital signs and the nursing notes.   HISTORY  Chief Complaint Off-balance    HPI Lorraine Sims is a 83 y.o. female with no significant past medical history who sent to the ED from primary care due to ataxia and concern for stroke.  Patient denies any recent trauma or falls.  No head injury or syncope.  No history of stroke.  Takes no medications currently.  Was in her usual state of health until a week ago when she noticed trouble walking and feeling off balance.  This was sudden onset and has been constant over the past week without aggravating or alleviating factors.  Also associated with tingling in the right upper extremity.  No vision changes.  No weakness.      Past Medical History:  Diagnosis Date  . History of shingles   . Hypertension    pt denies  . PMR (polymyalgia rheumatica) [facility])      Patient Active Problem List   Diagnosis Date Noted  . PMR (polymyalgia rheumatica) (HCC) 10/14/2016  . Essential hypertension 10/14/2016     Past Surgical History:  Procedure Laterality Date  . BREAST BIOPSY Right 1990  . CARPAL TUNNEL RELEASE Bilateral      Prior to Admission medications   Medication Sig Start Date End Date Taking? Authorizing Provider  calcium-vitamin D (OSCAL WITH D) 500-200 MG-UNIT tablet Take 1 tablet by mouth 2 (two) times daily.   Yes [provider]  Potassium 99 MG TABS Take 1 tablet by mouth daily.    Yes [provider]     Allergies Patient has no known allergies.   Family History  Problem Relation Age of Onset  . Lung cancer Mother   . Hypertension Mother   . Throat cancer Father   . Diabetes Neg Hx   . Stroke Neg Hx     Social History Social History   Tobacco Use  . Smoking status: Former Research scientist (life sciences)  . Smokeless tobacco: Never Used   Substance Use Topics  . Alcohol use: Yes    Comment: daily--wine  . Drug use: No    Review of Systems  Constitutional:   No fever or chills.  ENT:   No sore throat. No rhinorrhea. Cardiovascular:   No chest pain or syncope. Respiratory:   No dyspnea or cough. Gastrointestinal:   Negative for abdominal pain, vomiting and diarrhea.  Musculoskeletal:   Negative for focal pain or swelling All other systems reviewed and are negative except as documented above in ROS and HPI.  ____________________________________________   PHYSICAL EXAM:  VITAL SIGNS: ED Triage Vitals  Enc Vitals Group     BP 05/07/18 1611 (!) 171/89     Pulse Rate 05/07/18 1611 71     Resp 05/07/18 1611 18     Temp 05/07/18 1611 97.8 F (36.6 C)     Temp Source 05/07/18 1611 Oral     SpO2 05/07/18 1611 99 %     Weight 05/07/18 1611 125 lb (56.7 kg)     Height 05/07/18 1611 5\' 2"  (1.575 m)     Head Circumference --      Peak Flow --      Pain Score 05/07/18 1631 0     Pain Loc --      Pain Edu? --      Excl. in [place]? --  Vital signs reviewed, nursing assessments reviewed.   Constitutional:   Alert and oriented. Non-toxic appearance. Eyes:   Conjunctivae are normal. EOMI. PERRL. ENT      Head:   Normocephalic and atraumatic.      Nose:   No congestion/rhinnorhea.       Mouth/Throat:   MMM, no pharyngeal erythema. No peritonsillar mass.       Neck:   No meningismus. Full ROM. Hematological/Lymphatic/Immunilogical:   No cervical lymphadenopathy. Cardiovascular:   RRR. Symmetric bilateral radial and DP pulses.  No murmurs. Cap refill less than 2 seconds. Respiratory:   Normal respiratory effort without tachypnea/retractions. Breath sounds are clear and equal bilaterally. No wheezes/rales/rhonchi. Gastrointestinal:   Soft and nontender. Non distended. There is no CVA tenderness.  No rebound, rigidity, or guarding. Genitourinary:   deferred Musculoskeletal:   Normal range of motion in all extremities.  No joint effusions.  No lower extremity tenderness.  No edema. Neurologic:   Normal speech and language.  Motor grossly intact. No pronator drift, normal finger-to-nose Ataxic gait Cranial nerves III through XII intact.  No visual field cut No acute focal neurologic deficits are appreciated.  Skin:    Skin is warm, dry and intact. No rash noted.  No petechiae, purpura, or bullae.  ____________________________________________    LABS (pertinent positives/negatives) (all labs ordered are listed, but only abnormal results are displayed) Labs Reviewed  BASIC METABOLIC PANEL - Abnormal; Notable for the following components:      Result Value   Calcium 8.8 (*)    GFR calc non Af Amer 52 (*)    All other components within normal limits  URINALYSIS, COMPLETE (UACMP) WITH MICROSCOPIC - Abnormal; Notable for the following components:   Color, Urine COLORLESS (*)    APPearance CLEAR (*)    Specific Gravity, Urine 1.003 (*)    Hgb urine dipstick SMALL (*)    Leukocytes, UA TRACE (*)    All other components within normal limits  URINE CULTURE  CBC  TROPONIN I   ____________________________________________   EKG  Interpreted by me Sinus bradycardia rate of 59, normal axis and intervals.  Normal QRS ST segments and T waves.  ____________________________________________    RADIOLOGY  Ct Head Wo Contrast  Result Date: 05/07/2018 CLINICAL DATA:  Feeling off balance for 1 week, tingling RIGHT arm for several days and BILATERAL lower legs, history hypertension, former smoker EXAM: CT HEAD WITHOUT CONTRAST TECHNIQUE: Contiguous axial images were obtained from the base of the skull through the vertex without intravenous contrast. Sagittal and coronal MPR images reconstructed from axial data set. COMPARISON:  None FINDINGS: Brain: Generalized atrophy. Normal ventricular morphology. No midline shift or mass effect. Small vessel chronic ischemic changes of deep cerebral white matter. Probable  small old lacunar infarct at LEFT external capsule. No intracranial hemorrhage, mass lesion or evidence of acute infarction. No extra-axial fluid collections. Vascular: No hyperdense vessels. Skull: Intact Sinuses/Orbits: Clear Other: N/A IMPRESSION: Atrophy with small vessel chronic ischemic changes of deep cerebral white matter. Probable small old lacunar infarct LEFT external capsule. No acute intracranial abnormalities. Electronically Signed   By: Lavonia Dana M.D.   On: 05/07/2018 17:29   Mr Brain Wo Contrast  Result Date: 05/07/2018 CLINICAL DATA:  83 y/o  F; feeling off balance for 1 week. EXAM: MRI HEAD WITHOUT CONTRAST TECHNIQUE: Multiplanar, multiecho pulse sequences of the brain and surrounding structures were obtained without intravenous contrast. COMPARISON:  05/07/2018 CT head FINDINGS: Brain: No acute infarction, hemorrhage, hydrocephalus, extra-axial  collection or mass lesion. Several nonspecific T2 FLAIR hyperintensities in subcortical and periventricular white matter are compatible with mild chronic microvascular ischemic changes for age. Mild volume loss of the brain. Vascular: Normal flow voids. Skull and upper cervical spine: Normal marrow signal. Sinuses/Orbits: Right maxillary sinus opacification. No abnormal signal of mastoid air cells. Orbits are unremarkable. Other: None. IMPRESSION: 1. No acute intracranial abnormality identified. 2. Mild for age chronic microvascular ischemic changes and volume loss of the brain. 3. Right maxillary sinus opacification. Electronically Signed   By: Kristine Garbe M.D.   On: 05/07/2018 19:03    ____________________________________________   PROCEDURES Procedures  ____________________________________________  DIFFERENTIAL DIAGNOSIS   Stroke, intracranial hemorrhage, nutritional deficiency, hydrocephalus  CLINICAL IMPRESSION / ASSESSMENT AND PLAN / ED COURSE  Pertinent labs & imaging results that were available during my care of  the patient were reviewed by me and considered in my medical decision making (see chart for details).    Patient presents with isolated ambulatory dysfunction, appears to be slightly ataxic.  Doubt nutritional deficiency.  Most concern for intracranial hemorrhage versus cerebellar stroke.  CT head is unremarkable.  Will proceed with MRI brain.  Clinical Course as of May 07 2057  Fri May 07, 2018  1955 Mri negative. Will d/w patient regarding next steps.    [PS]    Clinical Course User Index [PS] Carrie Mew, MD     ----------------------------------------- 8:55 PM on 05/07/2018 -----------------------------------------  Discussed with patient regarding the so far negative diagnostic work-up.  Offered hospitalization for stroke work-up but patient refuses and wishes to follow-up with her primary care doctor.  I think this is reasonable.  I recommended that she start taking baby aspirin daily  ____________________________________________   FINAL CLINICAL IMPRESSION(S) / ED DIAGNOSES    Final diagnoses:  Ataxia     ED Discharge Orders    None      Portions of this note were generated with dragon dictation software. Dictation errors may occur despite best attempts at proofreading.    Carrie Mew, MD 05/07/18 [phone]

## 2018-05-07 NOTE — ED Notes (Signed)
Pt speaking with MRI to answer screening questions at this time.

## 2018-05-07 NOTE — ED Notes (Signed)
Pt ambulatory to toilet in room with this nurse. Pt does appear unsteady on feet upon first standing up then pt was steady walking to restroom.

## 2018-05-07 NOTE — Patient Instructions (Signed)
Please go directly to [facility] Emergency room as we discussed

## 2018-05-07 NOTE — Telephone Encounter (Signed)
Fyi.

## 2018-05-07 NOTE — Telephone Encounter (Signed)
Pt called with having some tingling that started in her right thumb and has gone up that arm. She has some numbness also. Also states some tingling both legs. Is able to ambulate without difficulty. No pain. No blurred vision, headache or  difficulty speaking or chest pain. She has been a little off balance the last few days. The only new medication (alker seltzer) she has taken was for a cold that she thought she was getting. She had been taking potassium for leg cramps but has not taken it for the last several days.  Appointment scheduled per protocol. Appointment scheduled at [facility] at [facility] (no availability at [facility]). Pt voiced understanding. And verbalized that any increase in symptoms such as the ones mentioned above including slurred speech with headaches, would need to be seen in the emergency department.  Reason for Disposition . [1] Tingling (e.g., pins and needles) of the face, arm / hand, or leg / foot on one side of the body AND [2] present now  Answer Assessment - Initial Assessment Questions 1. SYMPTOM: "What is the main symptom you are concerned about?" (e.g., weakness, numbness)     Numbness and tingling 2. ONSET: "When did this start?" (minutes, hours, days; while sleeping)     Started the last few days 3. LAST NORMAL: "When was the last time you were normal (no symptoms)?"     A few days ago 4. PATTERN "Does this come and go, or has it been constant since it started?"  "Is it present now?"     Constant now just started feeling really bad, shakey 5. CARDIAC SYMPTOMS: "Have you had any of the following symptoms: chest pain, difficulty breathing, palpitations?"     no 6. NEUROLOGIC SYMPTOMS: "Have you had any of the following symptoms: headache, dizziness, vision loss, double vision, changes in speech, unsteady on your feet?"     A little unsteady on feet, lightheaded, slightly vision change 7. OTHER SYMPTOMS: "Do you have any other symptoms?"     Tingling in legs  both of them  Protocols used: NEUROLOGIC DEFICIT-A-AH

## 2018-05-07 NOTE — ED Notes (Signed)
Pt ambulatory from triage to ED19; declined wheel chair. Given warm blanket. Lonn Georgia, RN notified that patient was in room.

## 2018-05-09 LAB — URINE CULTURE: Culture: NO GROWTH

## 2018-05-11 ENCOUNTER — Telehealth: Payer: Self-pay | Admitting: Family

## 2018-05-11 NOTE — Telephone Encounter (Signed)
Pt will be seeing Mable Paris for TOC.

## 2018-07-20 DIAGNOSIS — G4762 Sleep related leg cramps: Secondary | ICD-10-CM | POA: Insufficient documentation

## 2018-07-20 DIAGNOSIS — M859 Disorder of bone density and structure, unspecified: Secondary | ICD-10-CM | POA: Diagnosis not present

## 2018-07-20 DIAGNOSIS — M8580 Other specified disorders of bone density and structure, unspecified site: Secondary | ICD-10-CM | POA: Diagnosis not present

## 2018-07-20 DIAGNOSIS — E559 Vitamin D deficiency, unspecified: Secondary | ICD-10-CM | POA: Diagnosis not present

## 2018-08-13 DIAGNOSIS — H524 Presbyopia: Secondary | ICD-10-CM | POA: Diagnosis not present

## 2018-08-18 ENCOUNTER — Encounter: Payer: Self-pay | Admitting: Family

## 2018-08-18 ENCOUNTER — Ambulatory Visit (INDEPENDENT_AMBULATORY_CARE_PROVIDER_SITE_OTHER): Payer: Medicare HMO | Admitting: Family

## 2018-08-18 VITALS — BP 120/76 | HR 79 | Temp 98.0°F | Wt 127.8 lb

## 2018-08-18 DIAGNOSIS — I10 Essential (primary) hypertension: Secondary | ICD-10-CM | POA: Diagnosis not present

## 2018-08-18 DIAGNOSIS — M353 Polymyalgia rheumatica: Secondary | ICD-10-CM

## 2018-08-18 DIAGNOSIS — Z1239 Encounter for other screening for malignant neoplasm of breast: Secondary | ICD-10-CM

## 2018-08-18 NOTE — Progress Notes (Signed)
Subjective:    Patient ID: Lorraine Sims, female    DOB: [birth year], 83 y.o.   MRN: [id number]  CC: Lorraine Sims is a 83 y.o. female who presents today to establish care.    HPI: Feels well today, no complaints   PMR- stable. Follows with rheumatology. No longer on prednisone.   Osteoporosis - doing reclast annually.   H/o HTN. Medication for a short period of time. No longer on medication.  Denies exertional chest pain or pressure, numbness or tingling radiating to left arm or jaw, palpitations, dizziness, frequent headaches, changes in vision, or shortness of breath.    Advised to go emergency room for gait disturbance, diagnosed ataxia May 07, 2018.  CT head negative for acute findings.  CT showed atrophy with small vessel chronic ischemic changes.  Probable small old lacunar infarct on left.  MRI brain showed no acute abnormality.  Microvascular ischemic changes.  Right maxillary sinus opacification. Advised to start 81 mg aspirin  Last dexa 10/2016. Per patient she also had one in 2019 Over due mammogram.  Unable to locate records for pneumonia.   HISTORY:  Past Medical History:  Diagnosis Date  . History of shingles   . Hypertension    pt denies  . PMR (polymyalgia rheumatica) (HCC)    Past Surgical History:  Procedure Laterality Date  . BREAST BIOPSY Right 1990  . CARPAL TUNNEL RELEASE Bilateral    Family History  Problem Relation Age of Onset  . Lung cancer Mother   . Hypertension Mother   . Throat cancer Father   . Diabetes Neg Hx   . Stroke Neg Hx     Allergies: Patient has no known allergies. Current Outpatient Medications on File Prior to Visit  Medication Sig Dispense Refill  . calcium-vitamin D (OSCAL WITH D) 500-200 MG-UNIT tablet Take 1 tablet by mouth 2 (two) times daily.    . Potassium 99 MG TABS Take 1 tablet by mouth daily.      No current facility-administered medications on file prior to visit.     Social History   Tobacco Use  .  Smoking status: Former Research scientist (life sciences)  . Smokeless tobacco: Never Used  Substance Use Topics  . Alcohol use: Yes    Comment: daily--wine  . Drug use: No    Review of Systems  Constitutional: Negative for chills and fever.  Respiratory: Negative for cough.   Cardiovascular: Negative for chest pain and palpitations.  Gastrointestinal: Negative for nausea and vomiting.  Musculoskeletal: Negative for arthralgias (resolved at this time).      Objective:    BP 120/76 (BP Location: Left Arm, Patient Position: Sitting, Cuff Size: Normal)   Pulse 79   Temp 98 F (36.7 C)   Wt 127 lb 12.8 oz (58 kg)   SpO2 96%   BMI 23.37 kg/m  BP Readings from Last 3 Encounters:  08/18/18 120/76  05/07/18 (!) 159/68  05/07/18 140/80   Wt Readings from Last 3 Encounters:  08/18/18 127 lb 12.8 oz (58 kg)  05/07/18 125 lb (56.7 kg)  05/07/18 125 lb 8 oz (56.9 kg)    Physical Exam Vitals signs reviewed.  Constitutional:      Appearance: She is well-developed.  Eyes:     Conjunctiva/sclera: Conjunctivae normal.  Cardiovascular:     Rate and Rhythm: Normal rate and regular rhythm.     Pulses: Normal pulses.     Heart sounds: Normal heart sounds.  Pulmonary:     Effort: Pulmonary effort  is normal.     Breath sounds: Normal breath sounds. No wheezing, rhonchi or rales.  Skin:    General: Skin is warm and dry.  Neurological:     Mental Status: She is alert.  Psychiatric:        Speech: Speech normal.        Behavior: Behavior normal.        Thought Content: Thought content normal.        Assessment & Plan:   Problem List Items Addressed This Visit      Cardiovascular and Mediastinum   Essential hypertension    Well-controlled today without medication.  We will continue to follow.        Other   PMR (polymyalgia rheumatica) (HCC)    Symptoms controlled today.  Continue to follow with rheumatology.  Will follow      Screening for breast cancer - Primary    Mammogram due, patient has  not screened in some time.  Strongly encouraged her to start screening again.  Patient understands schedule if she desires to screen.      Relevant Orders   MM 3D SCREEN BREAST BILATERAL       I am having Lorraine Sims maintain her Potassium and calcium-vitamin D.   No orders of the defined types were placed in this encounter.   Return precautions given.   Risks, benefits, and alternatives of the medications and treatment plan prescribed today were discussed, and patient expressed understanding.   Education regarding symptom management and diagnosis given to patient on AVS.  Continue to follow with Burnard Hawthorne, FNP for routine health maintenance.   Lorraine Sims and I agreed with plan.   Mable Paris, FNP

## 2018-08-18 NOTE — Patient Instructions (Signed)
Please send immunization records via my chart when you can.  We are looking for pneumonia vaccines also tetanus  I strongly encourage you to consider mammogram.  We placed a referral for mammogram this year. I asked that you call one the below locations and schedule this when it is convenient for you.   As discussed, I would like you to ask for 3D mammogram over the traditional 2D mammogram as new evidence suggest 3D is superior.   Please note that NOT all insurance companies cover 3D and you may have to pay a higher copay. You may call your insurance company to further clarify your benefits.   Options for [place]  [place], [place]  * [facility], [place] * Note if you ask for 3D mammogram at this location, you must request Seth Ward, [place] location*

## 2018-08-20 DIAGNOSIS — Z1239 Encounter for other screening for malignant neoplasm of breast: Secondary | ICD-10-CM | POA: Insufficient documentation

## 2018-08-20 NOTE — Assessment & Plan Note (Signed)
Well-controlled today without medication.  We will continue to follow.

## 2018-08-20 NOTE — Assessment & Plan Note (Signed)
Symptoms controlled today.  Continue to follow with rheumatology.  Will follow

## 2018-08-20 NOTE — Assessment & Plan Note (Addendum)
Mammogram due, patient has not screened in some time.  Strongly encouraged her to start screening again.  Patient understands schedule if she desires to screen.

## 2018-09-20 ENCOUNTER — Encounter: Payer: Self-pay | Admitting: Family

## 2018-10-12 ENCOUNTER — Ambulatory Visit
Admission: RE | Admit: 2018-10-12 | Discharge: 2018-10-12 | Disposition: A | Discharge status: Home / Self Care | Payer: Medicare HMO | Source: Ambulatory Visit | Attending: Family | Admitting: Family

## 2018-10-12 DIAGNOSIS — Z1239 Encounter for other screening for malignant neoplasm of breast: Secondary | ICD-10-CM

## 2018-10-12 DIAGNOSIS — Z1231 Encounter for screening mammogram for malignant neoplasm of breast: Secondary | ICD-10-CM | POA: Diagnosis not present

## 2018-12-23 ENCOUNTER — Ambulatory Visit: Payer: Self-pay | Admitting: *Deleted

## 2018-12-23 ENCOUNTER — Other Ambulatory Visit: Payer: Self-pay

## 2018-12-23 ENCOUNTER — Ambulatory Visit (INDEPENDENT_AMBULATORY_CARE_PROVIDER_SITE_OTHER): Payer: Medicare HMO | Admitting: Family Medicine

## 2018-12-23 DIAGNOSIS — M353 Polymyalgia rheumatica: Secondary | ICD-10-CM | POA: Diagnosis not present

## 2018-12-23 DIAGNOSIS — R0600 Dyspnea, unspecified: Secondary | ICD-10-CM

## 2018-12-23 MED ORDER — PREDNISONE 5 MG PO TABS: ORAL_TABLET | ORAL | 0 refills | Status: DC

## 2018-12-23 NOTE — Telephone Encounter (Signed)
Pt reports SOB, onset 3 days ago. States noted today mild SOB at rest. Denies wheezing, states afebrile. Also reports "Runny nose and dry mouth" States had slight dry cough for 3 weeks, none presently or for past week. Also reports "Left leg sore when walking only." onset week ago. States "Stiff" from calf to foot. Denies any swelling, redness, warmth. No travel, no known exposure.TN called practice for consideration of virtual appt, pt directed to ED. Pt made aware of recommendation. Unsure if she will follow. States "I may see how I feel later this afternoon." Care advise given. Pt will call ahead to ED if follows disposition.  Answer Assessment - Initial Assessment Questions 1. COVID-19 DIAGNOSIS: "Who made your Coronavirus (COVID-19) diagnosis?" "Was it confirmed by a positive lab test?" If not diagnosed by a HCP, ask "Are there lots of cases (community spread) where you live?" (See public health department website, if unsure)   * MAJOR community spread: high number of cases; numbers of cases are increasing; many people hospitalized.   * MINOR community spread: low number of cases; not increasing; few or no people hospitalized     N/A 2. ONSET: "When did the COVID-19 symptoms start?"      SOB 2 days ago 3. WORST SYMPTOM: "What is your worst symptom?" (e.g., cough, fever, shortness of breath, muscle aches)    SOB, left leg "Sore" 4. COUGH: "How bad is the cough?"      slight 5. FEVER: "Do you have a fever?" If so, ask: "What is your temperature, how was it measured, and when did it start?"     no 6. RESPIRATORY STATUS: "Describe your breathing?" (e.g., shortness of breath, wheezing, unable to speak)     Mild SOB at rest onset today 7. BETTER-SAME-WORSE: "Are you getting better, staying the same or getting worse compared to yesterday?"  If getting worse, ask, "In what way?"     SOB worse today, mild 8. HIGH RISK DISEASE: "Do you have any chronic medical problems?" (e.g., asthma, heart or lung  disease, weak immune system, etc.)     yes  10. OTHER SYMPTOMS: "Do you have any other symptoms?"  (e.g., runny nose, headache, sore throat, loss of smell)       Runny nose  Protocols used: CORONAVIRUS (COVID-19) DIAGNOSED OR SUSPECTED-A-AH

## 2018-12-23 NOTE — Telephone Encounter (Signed)
Please advise since PCP is out of office today.

## 2018-12-23 NOTE — Progress Notes (Signed)
Patient ID: Lorraine Sims, female   DOB: Nov 11, 1934, 84 y.o.   MRN: [id number]  Virtual Visit via video Note  This visit type was conducted due to national recommendations for restrictions regarding the COVID-19 pandemic (e.g. social distancing).  This format is felt to be most appropriate for this patient at this time.  All issues noted in this document were discussed and addressed.  No physical exam was performed (except for noted visual exam findings with Video Visits).   I connected with Lorraine Sims on 12/24/18 at  1:40 PM EDT by a video enabled telemedicine application and verified that I am speaking with the correct person using two identifiers. Location patient: home Location provider: [facility] Persons participating in the virtual visit: patient, provider  I discussed the limitations, risks, security and privacy concerns of performing an evaluation and management service by video and the availability of in person appointments. I also discussed with the patient that there may be a patient responsible charge related to this service. The patient expressed understanding and agreed to proceed.   HPI:  Patient and I connected via video due to complaints of shortness of breath and leg pain.  Patient states the shortness of breath has been present off and on for approximately 3 to 4 weeks.  She will notice it especially when going for walks.  Also has been having bilateral leg pain flaring up.   Patient has a known history of polymyalgia rheumatica.  States that she had been on a low dose of steroid every day in [place] prior to moving out to [place].  [place] wean her off every day prednisone and only gives her steroid burst if needed for any flares.    Patient states she had similar issues occur last spring with the little bit of shortness of breath while walking, and some soreness in her chest.  States current symptoms feel very similar to what occurred last  year.  States last year when the symptoms happened, she was given steroid taper down and everything improved back to normal.  States she did have a litle bit of nasal congestion, mild cough about 3 weeks ago however the symptoms resolved within 3 days.  She has not been in contact with anyone who is under investigation or diagnosed with COVID-19.  Other than going for walks in her neighborhood with her husband, she has not been in any contact with any other person.  No fever or chills.  No palpitations.  No left-sided chest pain, pain in left arm or up into jaw.  No feeling faint or dizzy.  No sweating.  No nausea or vomiting.  No urinary complaints.  No wheezing.  No productive cough, no dry cough.   ROS: See pertinent positives and negatives per HPI.  Past Medical History:  Diagnosis Date  . History of shingles   . Hypertension    pt denies  . PMR (polymyalgia rheumatica) (HCC)     Past Surgical History:  Procedure Laterality Date  . BREAST BIOPSY Right 1990   neg  . CARPAL TUNNEL RELEASE Bilateral     Family History  Problem Relation Age of Onset  . Lung cancer Mother   . Hypertension Mother   . Throat cancer Father   . Diabetes Neg Hx   . Stroke Neg Hx   . Breast cancer Neg Hx    Social History   Tobacco Use  . Smoking status: Former Research scientist (life sciences)  . Smokeless tobacco: Never Used  Substance Use Topics  . Alcohol use: Yes    Comment: daily--wine    Current Outpatient Medications:  .  calcium-vitamin D (OSCAL WITH D) 500-200 MG-UNIT tablet, Take 1 tablet by mouth 2 (two) times daily., Disp: , Rfl:  .  Potassium 99 MG TABS, Take 1 tablet by mouth daily. , Disp: , Rfl:  .  predniSONE (DELTASONE) 5 MG tablet, Take 5 mg daily for 10 days. Then take 2.5 mg (1/2 tablet) daily for 10 days. Then stop., Disp: 15 tablet, Rfl: 0  EXAM:  GENERAL: alert, oriented, appears well and in no acute distress  HEENT: atraumatic, conjunttiva clear, no obvious abnormalities on inspection of  external nose and ears  NECK: normal movements of the head and neck  LUNGS: on inspection no signs of respiratory distress, breathing rate appears normal, no obvious gross SOB, gasping or wheezing  CV: no obvious cyanosis  MS: moves all visible extremities without noticeable abnormality  PSYCH/NEURO: pleasant and cooperative, no obvious depression or anxiety, speech and thought processing grossly intact  ASSESSMENT AND PLAN:  Discussed the following assessment and plan:  PMR (polymyalgia rheumatica) (HCC) - Plan: predniSONE (DELTASONE) 5 MG tablet  Dyspnea, unspecified type - Plan: predniSONE (DELTASONE) 5 MG tablet   Patient appears to be in no distress & I feel recommendation to go to ER as advised by the triage RN is not appropriate.  I believe patient going to the ER at this time with only risk her being exposed to more sick people rather than being helpful. I do not have strong suspicion for COVID-19 infection in this patient due to her having this dyspnea issue previously and her current symptoms presenting in a similar fashion.  I do suspect her leg pain is related to polymyalgia rheumatica, and dyspnea could potentially be an exacerbation of her chronic rheumatological issues.  Patient and I discussed different options, and we will do a steroid taper down.  After steroid taper is finished, patient has been advised to let us know how she is doing.  If she is not feeling better we can at that time consider lab work, chest x-ray and or EKG if needed.     I discussed the assessment and treatment plan with the patient. The patient was provided an opportunity to ask questions and all were answered. The patient agreed with the plan and demonstrated an understanding of the instructions.   The patient was advised to call back or seek an in-person evaluation if the symptoms worsen or if the condition fails to improve as anticipated.   Jodelle Green, FNP

## 2018-12-23 NOTE — Telephone Encounter (Signed)
Pt's husband called back following previous recommendation to take pt. To the ER.  Stated "I just don't think she is Emergency Room worthy."  She is having "minor shortness of breath and very mild chest pain, and dry cough."  Denied fever.  Denied headache.  Stated "I would like to have her evaluated by her own PCP."  Advised that the chest pain would have to be evaluated in the ER.  Husband was not able to answer where her chest pain is located, if it is constant or intermittent.  Stated the pt. is in the shower at this time.  Husband is asking if PCP could evaluate her 1st, to determine if she really needs to be seen at the ER.   Advised will send updated note to provider, and to expect a call back with further recommendation.  Husband agreed with plan.

## 2018-12-23 NOTE — Telephone Encounter (Signed)
appointment scheduled with lauren guse, today.  Nina,cma

## 2018-12-24 ENCOUNTER — Encounter: Payer: Self-pay | Admitting: Family Medicine

## 2018-12-24 NOTE — Telephone Encounter (Signed)
Seen 12/23/18

## 2019-03-18 ENCOUNTER — Encounter: Payer: Self-pay | Admitting: Family

## 2019-03-18 ENCOUNTER — Ambulatory Visit (INDEPENDENT_AMBULATORY_CARE_PROVIDER_SITE_OTHER): Payer: Medicare HMO | Admitting: Family

## 2019-03-18 ENCOUNTER — Other Ambulatory Visit: Payer: Self-pay

## 2019-03-18 ENCOUNTER — Ambulatory Visit: Payer: Self-pay | Admitting: *Deleted

## 2019-03-18 VITALS — Ht 62.01 in | Wt 127.8 lb

## 2019-03-18 DIAGNOSIS — S81812A Laceration without foreign body, left lower leg, initial encounter: Secondary | ICD-10-CM

## 2019-03-18 DIAGNOSIS — L0390 Cellulitis, unspecified: Secondary | ICD-10-CM | POA: Diagnosis not present

## 2019-03-18 DIAGNOSIS — S81819A Laceration without foreign body, unspecified lower leg, initial encounter: Secondary | ICD-10-CM | POA: Insufficient documentation

## 2019-03-18 MED ORDER — MUPIROCIN 2 % EX OINT: 1.0000 "application " | TOPICAL_OINTMENT | Freq: Two times a day (BID) | CUTANEOUS | 0 refills | Status: DC

## 2019-03-18 MED ORDER — CEPHALEXIN 500 MG PO CAPS: 500.0000 mg | ORAL_CAPSULE | Freq: Four times a day (QID) | ORAL | 0 refills | Status: DC

## 2019-03-18 NOTE — Progress Notes (Signed)
Pt cc left leg open wound.  Pt said that she had an injury over 2 weeks ago from hitting leg on car door.  Pt said wound still hasn't healed.

## 2019-03-18 NOTE — Progress Notes (Signed)
This visit type was conducted due to national recommendations for restrictions regarding the COVID-19 pandemic (e.g. social distancing).  This format is felt to be most appropriate for this patient at this time.  All issues noted in this document were discussed and addressed.  No physical exam was performed (except for noted visual exam findings with Video Visits).  Virtual Visit via Video Note  I connected with@  on 03/18/19 at 11:30 AM EDT by a video enabled telemedicine application and verified that I am speaking with the correct person using two identifiers.  Location patient: home Location provider:work Persons participating in the virtual visit: patient, provider  I discussed the limitations of evaluation and management by telemedicine and the availability of in person appointments. The patient expressed understanding and agreed to proceed.  Interactive audio and video telecommunications were attempted between this provider and patient, however failed, due to patient having technical difficulties or patient did not have access to video capability.  We continued and completed visit with audio only.     HPI:  CC: left leg wound x 2 weeks ago, unchanged.    Long laceration oblique in shape, dime in size.  Hit leg on car and 'banged shin'.  Describes as ache in left shin. No swelling in calves. No calve in tenderness. No numbness. Leg is not pale.   Doesn't feel would is healing. Ache with walking in left lower leg, resolves with rest. Edges of wound are not coming together. No discharge from wound.   Using triple P ointment and cannot tell a difference Had been swimming in pool recently with wound covered.   Has been bandaged at home.   H/o PMR  No fever, sob, n, v.   ROS: See pertinent positives and negatives per HPI.  Past Medical History:  Diagnosis Date  . History of shingles   . Hypertension    pt denies  . PMR (polymyalgia rheumatica) (HCC)     Past Surgical  History:  Procedure Laterality Date  . BREAST BIOPSY Right 1990   neg  . CARPAL TUNNEL RELEASE Bilateral     Family History  Problem Relation Age of Onset  . Lung cancer Mother   . Hypertension Mother   . Throat cancer Father   . Diabetes Neg Hx   . Stroke Neg Hx   . Breast cancer Neg Hx     SOCIAL HX: former smoker   Current Outpatient Medications:  .  acetaminophen (TYLENOL) 500 MG tablet, Take 500 mg by mouth every 12 (twelve) hours as needed., Disp: , Rfl:  .  calcium-vitamin D (OSCAL WITH D) 500-200 MG-UNIT tablet, Take 1 tablet by mouth 2 (two) times daily., Disp: , Rfl:  .  cephALEXin (KEFLEX) 500 MG capsule, Take 1 capsule (500 mg total) by mouth every 6 (six) hours., Disp: 28 capsule, Rfl: 0 .  mupirocin ointment (BACTROBAN) 2 %, Apply 1 application topically 2 (two) times daily., Disp: 22 g, Rfl: 0 .  Potassium 99 MG TABS, Take 1 tablet by mouth daily. , Disp: , Rfl:  .  predniSONE (DELTASONE) 5 MG tablet, Take 5 mg daily for 10 days. Then take 2.5 mg (1/2 tablet) daily for 10 days. Then stop. (Patient not taking: Reported on 03/18/2019), Disp: 15 tablet, Rfl: 0  EXAM:  VITALS per patient if applicable:  GENERAL: alert, oriented, appears well and in no acute distress  HEENT: atraumatic, conjunttiva clear, no obvious abnormalities on inspection of external nose and ears  NECK: normal movements  of the head and neck  LUNGS: on inspection no signs of respiratory distress, breathing rate appears normal, no obvious gross SOB, gasping or wheezing  CV: no obvious cyanosis  MS: moves all visible extremities without noticeable abnormality  PSYCH/NEURO: pleasant and cooperative, no obvious depression or anxiety, speech and thought processing grossly intact  SKIN: Prior video failure, I was able to see approximately dime size oblong shape laceration left mid shin.  No edema appreciated.  No erythema or streaks appreciated.  Light-colored skin which appearred to be  epithelization noted along edges of wound. NO drainage.   ASSESSMENT AND PLAN:  Discussed the following assessment and plan:  Problem List Items Addressed This Visit      Other   Leg laceration    Patient is nontoxic in appearance. No edema. Will treat empirically for infection with bactroban, keflex.  However concerned that leg pain is related to intermittent claudication or underlying arterial disease which may be reason for poor/delayed healing. Considering ache to be symptom of PMR as well. Patient follows with Dr Meda Coffee and will continue to do so. Referral to vascular. Patient will let if no improvement so I can place referral to wound care as well ( she declined today).        Other Visit Diagnoses    Cellulitis, unspecified cellulitis site    -  Primary   Relevant Medications   cephALEXin (KEFLEX) 500 MG capsule   mupirocin ointment (BACTROBAN) 2 %   Other Relevant Orders   Ambulatory referral to Vascular Surgery        I discussed the assessment and treatment plan with the patient. The patient was provided an opportunity to ask questions and all were answered. The patient agreed with the plan and demonstrated an understanding of the instructions.   The patient was advised to call back or seek an in-person evaluation if the symptoms worsen or if the condition fails to improve as anticipated.   Mable Paris, FNP   I spent 25 min non face to face w/ pt.

## 2019-03-18 NOTE — Assessment & Plan Note (Signed)
Patient is nontoxic in appearance. No edema. Will treat empirically for infection with bactroban, keflex.  However concerned that leg pain is related to intermittent claudication or underlying arterial disease which may be reason for poor/delayed healing. Considering ache to be symptom of PMR as well. Patient follows with Dr Meda Coffee and will continue to do so. Referral to vascular. Patient will let if no improvement so I can place referral to wound care as well ( she declined today).

## 2019-03-18 NOTE — Patient Instructions (Addendum)
Start keflex for concern for possible skin infection.  Ensure to take probiotics while on antibiotics and also for 2 weeks after completion. It is important to re-colonize the gut with good bacteria and also to prevent any diarrheal infections associated with antibiotic use.    Use bactoban.   Considering intermittent claudication   Let me know if wound or suspected infection doesn't improve.   Today we discussed referrals, orders. Vascular ( [facility])   I have placed these orders in the system for you.  Please be sure to give [place] a call if you have not heard from our office regarding this. We should hear from [place] within ONE week with information regarding your appointment. If not, please let me know immediately.   Stay safe !

## 2019-03-18 NOTE — Telephone Encounter (Signed)
Patient is calling with wound on leg that will not heal. Call to office for appointment.  Reason for Disposition . No prior tetanus shots (or is not fully vaccinated)  Answer Assessment - Initial Assessment Questions 1. APPEARANCE of INJURY: "What does the injury look like?"      Gash that is 1/4 inch long, 1/8 inch wide. Dark bruise around edge. Edges are not scabbing it is open. Some slight pain- looks worse today than in past 2. SIZE: "How large is the cut?"      See above 3. BLEEDING: "Is it bleeding now?" If so, ask: "Is it difficult to stop?"      Patient is using ointment- no bleeding 4. LOCATION: "Where is the injury located?"      Left shin 5. ONSET: "How long ago did the injury occur?"      2 weeks 6. MECHANISM: "Tell me how it happened."      Hit leg when getting in car 7. TETANUS: "When was the last tetanus booster?"     Not recorded 8. PREGNANCY: "Is there any chance you are pregnant?" "When was your last menstrual period?"     n/a  Protocols used: [place]

## 2019-03-21 DIAGNOSIS — M810 Age-related osteoporosis without current pathological fracture: Secondary | ICD-10-CM | POA: Diagnosis not present

## 2019-03-21 DIAGNOSIS — M8588 Other specified disorders of bone density and structure, other site: Secondary | ICD-10-CM | POA: Diagnosis not present

## 2019-04-12 ENCOUNTER — Encounter (INDEPENDENT_AMBULATORY_CARE_PROVIDER_SITE_OTHER): Payer: Medicare HMO | Admitting: Vascular Surgery

## 2019-04-15 DIAGNOSIS — M8580 Other specified disorders of bone density and structure, unspecified site: Secondary | ICD-10-CM | POA: Diagnosis not present

## 2019-05-26 ENCOUNTER — Other Ambulatory Visit: Payer: Self-pay

## 2019-05-26 ENCOUNTER — Ambulatory Visit (INDEPENDENT_AMBULATORY_CARE_PROVIDER_SITE_OTHER): Payer: Medicare HMO

## 2019-05-26 DIAGNOSIS — Z0000 Encounter for general adult medical examination without abnormal findings: Secondary | ICD-10-CM | POA: Diagnosis not present

## 2019-05-26 NOTE — Patient Instructions (Addendum)
  Lorraine Sims , Thank you for taking time to come for your Medicare Wellness Visit. I appreciate your ongoing commitment to your health goals. Please review the following plan we discussed and let me know if I can assist you in the future.   These are the goals we discussed: Goals      Patient Stated   . Follow up with Primary Care Provider (pt-stated)     As needed       This is a list of the screening recommended for you and due dates:  Health Maintenance  Topic Date Due  . Tetanus Vaccine  05/24/1956  . Flu Shot  11/30/2019*  . DEXA scan (bone density measurement)  05/25/2020*  . Pneumonia vaccines (1 of 2 - PCV13) 05/25/2020*  *Topic was postponed. The date shown is not the original due date.

## 2019-05-26 NOTE — Progress Notes (Addendum)
Subjective:   Lorraine Sims is a 84 y.o. female who presents for Medicare Annual (Subsequent) preventive examination.  Review of Systems:  No ROS.  Medicare Wellness Virtual Visit.  Visual/audio telehealth visit, UTA vital signs.   See social history for additional risk factors.   Cardiac Risk Factors include: advanced age (>20men, >38 women);hypertension     Objective:     Vitals: There were no vitals taken for this visit.  There is no height or weight on file to calculate BMI.  Advanced Directives 05/26/2019 05/07/2018  Does Patient Have a Medical Advance Directive? Yes Yes  Type of Paramedic of [place];Living will [place];Living will  Does patient want to make changes to medical advance directive? No - Patient declined -  Copy of [place] in Chart? No - copy requested -    Tobacco Social History   Tobacco Use  Smoking Status Former Smoker  Smokeless Tobacco Never Used     Counseling given: Not Answered   Clinical Intake:  Pre-visit preparation completed: Yes        Diabetes: No  How often do you need to have someone help you when you read instructions, pamphlets, or other written materials from your doctor or pharmacy?: 1 - Never  Interpreter Needed?: No     Past Medical History:  Diagnosis Date  . History of shingles   . Hypertension    pt denies  . PMR (polymyalgia rheumatica) (HCC)    Past Surgical History:  Procedure Laterality Date  . BREAST BIOPSY Right 1990   neg  . CARPAL TUNNEL RELEASE Bilateral    Family History  Problem Relation Age of Onset  . Lung cancer Mother   . Hypertension Mother   . Throat cancer Father   . Diabetes Neg Hx   . Stroke Neg Hx   . Breast cancer Neg Hx    Social History   Socioeconomic History  . Marital status: Married    Spouse name: Not on file  . Number of children: Not on file  . Years of education: Not on file  . Highest education  level: Not on file  Occupational History  . Not on file  Social Needs  . Financial resource strain: Not hard at all  . Food insecurity    Worry: Never true    Inability: Never true  . Transportation needs    Medical: No    Non-medical: No  Tobacco Use  . Smoking status: Former Research scientist (life sciences)  . Smokeless tobacco: Never Used  Substance and Sexual Activity  . Alcohol use: Yes    Comment: daily--wine  . Drug use: No  . Sexual activity: Yes  Lifestyle  . Physical activity    Days per week: 4 days    Minutes per session: 50 min  . Stress: Not at all  Relationships  . Social Herbalist on phone: Not on file    Gets together: Not on file    Attends religious service: Not on file    Active member of club or organization: Not on file    Attends meetings of clubs or organizations: Not on file    Relationship status: Not on file  Other Topics Concern  . Not on file  Social History Narrative  . Not on file    Outpatient Encounter Medications as of 05/26/2019  Medication Sig  . acetaminophen (TYLENOL) 500 MG tablet Take 500 mg by mouth every 12 (  twelve) hours as needed.  . calcium-vitamin D (OSCAL WITH D) 500-200 MG-UNIT tablet Take 1 tablet by mouth 2 (two) times daily.  . mupirocin ointment (BACTROBAN) 2 % Apply 1 application topically 2 (two) times daily.  . Potassium 99 MG TABS Take 1 tablet by mouth daily.   . [DISCONTINUED] cephALEXin (KEFLEX) 500 MG capsule Take 1 capsule (500 mg total) by mouth every 6 (six) hours.  . [DISCONTINUED] predniSONE (DELTASONE) 5 MG tablet Take 5 mg daily for 10 days. Then take 2.5 mg (1/2 tablet) daily for 10 days. Then stop. (Patient not taking: Reported on 03/18/2019)   No facility-administered encounter medications on file as of 05/26/2019.     Activities of Daily Living In your present state of health, do you have any difficulty performing the following activities: 05/26/2019  Hearing? N  Vision? N  Difficulty concentrating or making  decisions? N  Walking or climbing stairs? N  Dressing or bathing? N  Doing errands, shopping? N  Preparing Food and eating ? N  Using the Toilet? N  In the past six months, have you accidently leaked urine? N  Do you have problems with loss of bowel control? N  Managing your Medications? N  Managing your Finances? N  Housekeeping or managing your Housekeeping? N  Some recent data might be hidden    Patient Care Team: Burnard Hawthorne, FNP as PCP - General (Family Medicine)    Assessment:   This is a routine wellness examination for Lorraine Sims.  I connected with patient 05/26/19 at  8:30 AM EDT by an audio enabled telemedicine application and verified that I am speaking with the correct person using two identifiers. Patient stated full name and DOB. Patient gave permission to continue with virtual visit. Patient's location was at home and Nurse's location was at [facility].   Health Maintenance Due: -Influenza vaccine 2020- discussed; to be completed in season with doctor or local pharmacy.   -PNA - She believes she had the series completed while living in [place], plans to verify. .  -Tdap- discussed; to be completed with doctor in visit or local pharmacy.   -Dexa Scan- states she has infusion completed annually by rheumatologist.  Update all pending maintenance due as appropriate.   See completed HM at the end of note.   Eye: Visual acuity not assessed. Virtual visit. Wears corrective lenses. Followed by their ophthalmologist every 12 months.   Dental: Visits every 6 months.    Hearing: Demonstrates normal hearing during visit.  Safety:  Patient feels safe at home- yes Patient does have smoke detectors at home- yes Patient does wear sunscreen or protective clothing when in direct sunlight - yes Patient does wear seat belt when in a moving vehicle - yes Patient drives- yes Adequate lighting in walkways free from debris- yes Grab bars and handrails used as  appropriate- yes Ambulates with no assistive device Cell phone on person when ambulating outside of the home- yes  Social: Alcohol intake - yes      Smoking history- former    Smokers in home? none Illicit drug use? none  Depression: PHQ 2 &9 complete. See screening below. Denies irritability, anhedonia, sadness/tearfullness.  Stable.   Falls: See screening below.    Medication: Taking as directed and without issues.   Covid-19: Precautions and sickness symptoms discussed. Wears mask, social distancing, hand hygiene as appropriate.   Activities of Daily Living Patient denies needing assistance with: household chores, feeding themselves, getting from bed to chair,  getting to the toilet, bathing/showering, dressing, managing money, or preparing meals.   Memory: Patient is alert. Patient denies difficulty focusing or concentrating. Correctly identified the president of the [place], season and recall. Patient likes to read, play sodoku, and attends Bible study for brain stimulation.  BMI- discussed the importance of a healthy diet, water intake and the benefits of aerobic exercise.  Educational material provided.  Physical activity- swimming and ball exercises   Diet: Healthy Water: good intake  Other Providers Patient Care Team: Burnard Hawthorne, FNP as PCP - General (Family Medicine)  Exercise Activities and Dietary recommendations Current Exercise Habits: Home exercise routine, Type of exercise: calisthenics;stretching(swimming), Time (Minutes): 45, Frequency (Times/Week): 4, Weekly Exercise (Minutes/Week): 180, Intensity: Mild  Goals      Patient Stated   . Follow up with Primary Care Provider (pt-stated)     As needed       Fall Risk Fall Risk  05/26/2019 01/06/2018  Falls in the past year? 0 Yes  Number falls in past yr: - 1  Injury with Fall? - No   Depression Screen PHQ 2/9 Scores 05/26/2019 03/18/2019 01/06/2018  PHQ - 2 Score 0 0 0     Cognitive Function      6CIT Screen 05/26/2019  What Year? 0 points  What month? 0 points  What time? 0 points  Count back from 20 0 points  Months in reverse 0 points  Repeat phrase 0 points  Total Score 0    Immunization History  Administered Date(s) Administered  . Influenza, High Dose Seasonal PF 05/26/2018    Screening Tests Health Maintenance  Topic Date Due  . TETANUS/TDAP  05/24/1956  . INFLUENZA VACCINE  11/30/2019 (Originally 04/02/2019)  . DEXA SCAN  05/25/2020 (Originally 05/24/2002)  . PNA vac Low Risk Adult (1 of 2 - PCV13) 05/25/2020 (Originally 05/24/2002)      Plan:   Keep all routine maintenance appointments.   Medicare Attestation I have personally reviewed: The patient's medical and social history Their use of alcohol, tobacco or illicit drugs Their current medications and supplements The patient's functional ability including ADLs,fall risks, home safety risks, cognitive, and hearing and visual impairment Diet and physical activities Evidence for depression   In addition, I have reviewed and discussed with patient certain preventive protocols, quality metrics, and best practice recommendations. A written personalized care plan for preventive services as well as general preventive health recommendations were provided to patient via mail.     Varney Biles, LPN  [id number]   Reviewed above information.  Agree with assessment and plan.    Dr Nicki Reaper

## 2019-07-19 DIAGNOSIS — B029 Zoster without complications: Secondary | ICD-10-CM | POA: Diagnosis not present

## 2019-07-19 DIAGNOSIS — D1800 Hemangioma unspecified site: Secondary | ICD-10-CM | POA: Diagnosis not present

## 2019-09-23 ENCOUNTER — Ambulatory Visit: Payer: Medicare HMO | Attending: Internal Medicine

## 2019-09-23 DIAGNOSIS — Z23 Encounter for immunization: Secondary | ICD-10-CM | POA: Insufficient documentation

## 2019-09-23 NOTE — Progress Notes (Signed)
   Covid-19 Vaccination Clinic  Name:  Lorraine Sims    MRN: [id number] DOB: 06/07/1935  09/23/2019  [name] was observed post Covid-19 immunization for 15 minutes without incidence. She was provided with Vaccine Information Sheet and instruction to access the V-Safe system.   [name] was instructed to call 911 with any severe reactions post vaccine: Marland Kitchen Difficulty breathing  . Swelling of your face and throat  . A fast heartbeat  . A bad rash all over your body  . Dizziness and weakness    Immunizations Administered    Name Date Dose VIS Date Route   Pfizer COVID-19 Vaccine 09/23/2019  6:47 PM 0.3 mL 08/12/2019 Intramuscular   Manufacturer: [place]   Lot: [id number]   [place]: [id number]

## 2019-10-11 ENCOUNTER — Ambulatory Visit: Payer: Medicare HMO | Attending: Internal Medicine

## 2019-10-11 DIAGNOSIS — Z23 Encounter for immunization: Secondary | ICD-10-CM

## 2019-10-11 NOTE — Progress Notes (Signed)
   Covid-19 Vaccination Clinic  Name:  Lorraine Sims    MRN: [id number] DOB: 1935/07/26  10/11/2019  Lorraine Sims was observed post Covid-19 immunization for 15 minutes without incidence. She was provided with Vaccine Information Sheet and instruction to access the V-Safe system.   Lorraine Sims was instructed to call 911 with any severe reactions post vaccine: Marland Kitchen Difficulty breathing  . Swelling of your face and throat  . A fast heartbeat  . A bad rash all over your body  . Dizziness and weakness    Immunizations Administered    Name Date Dose VIS Date Route   Pfizer COVID-19 Vaccine 10/11/2019  9:52 AM 0.3 mL 08/12/2019 Intramuscular   Manufacturer: [place]   Lot: [id number]   [place]: [id number]

## 2019-12-01 DIAGNOSIS — H524 Presbyopia: Secondary | ICD-10-CM | POA: Diagnosis not present

## 2020-01-16 DIAGNOSIS — M810 Age-related osteoporosis without current pathological fracture: Secondary | ICD-10-CM | POA: Diagnosis not present

## 2020-01-16 DIAGNOSIS — E559 Vitamin D deficiency, unspecified: Secondary | ICD-10-CM | POA: Diagnosis not present

## 2020-02-09 ENCOUNTER — Other Ambulatory Visit: Payer: Self-pay | Admitting: Family

## 2020-02-09 DIAGNOSIS — Z1231 Encounter for screening mammogram for malignant neoplasm of breast: Secondary | ICD-10-CM

## 2020-03-08 ENCOUNTER — Ambulatory Visit
Admission: RE | Admit: 2020-03-08 | Discharge: 2020-03-08 | Disposition: A | Discharge status: Home / Self Care | Payer: Medicare HMO | Source: Ambulatory Visit | Attending: Family | Admitting: Family

## 2020-03-08 DIAGNOSIS — Z1231 Encounter for screening mammogram for malignant neoplasm of breast: Secondary | ICD-10-CM | POA: Insufficient documentation

## 2020-03-28 ENCOUNTER — Other Ambulatory Visit: Payer: Self-pay

## 2020-04-04 ENCOUNTER — Ambulatory Visit (INDEPENDENT_AMBULATORY_CARE_PROVIDER_SITE_OTHER): Payer: Medicare HMO | Admitting: Family

## 2020-04-04 ENCOUNTER — Encounter: Payer: Self-pay | Admitting: Family

## 2020-04-04 ENCOUNTER — Other Ambulatory Visit: Payer: Self-pay

## 2020-04-04 ENCOUNTER — Ambulatory Visit (INDEPENDENT_AMBULATORY_CARE_PROVIDER_SITE_OTHER): Payer: Medicare HMO

## 2020-04-04 VITALS — BP 180/88 | HR 86 | Temp 98.3°F | Ht 62.01 in | Wt 130.4 lb

## 2020-04-04 DIAGNOSIS — M79604 Pain in right leg: Secondary | ICD-10-CM | POA: Diagnosis not present

## 2020-04-04 DIAGNOSIS — M79605 Pain in left leg: Secondary | ICD-10-CM

## 2020-04-04 DIAGNOSIS — M79606 Pain in leg, unspecified: Secondary | ICD-10-CM | POA: Insufficient documentation

## 2020-04-04 DIAGNOSIS — I700 Atherosclerosis of aorta: Secondary | ICD-10-CM | POA: Diagnosis not present

## 2020-04-04 DIAGNOSIS — M47816 Spondylosis without myelopathy or radiculopathy, lumbar region: Secondary | ICD-10-CM | POA: Diagnosis not present

## 2020-04-04 DIAGNOSIS — I10 Essential (primary) hypertension: Secondary | ICD-10-CM

## 2020-04-04 DIAGNOSIS — M4316 Spondylolisthesis, lumbar region: Secondary | ICD-10-CM | POA: Diagnosis not present

## 2020-04-04 MED ORDER — AMLODIPINE BESYLATE 2.5 MG PO TABS: 2.5000 mg | ORAL_TABLET | Freq: Every day | ORAL | 3 refills | Status: AC

## 2020-04-04 NOTE — Assessment & Plan Note (Signed)
Bilateral.  Reassured by exam today.  Discussed differentials including intermittent claudication, degenerative disc disease/stenosis.  Pending x-ray and vascular consult.  Close follow

## 2020-04-04 NOTE — Patient Instructions (Addendum)
Take 1/2 tablet prednisone tomorrow and then stop prednisone as concern it is elevating blood pressure  Blood pressure is very high today;please monitor when you get home.  Please go ahead and start amlodipine 2.5mg  today.   If you experience any chest pain, headache, vision changes, dizziness, left arm numbness, please call 911.   monitor blood pressure at home and me 5-6 reading on separate days. Goal is less than 120/80, based on newest guidelines, however we certainly want to be less than 130/80;  if persistently higher, please make sooner follow up appointment so we can recheck you blood pressure and manage/ adjust medications.  Referral to vascular  Let us know if you dont hear back within a week in regards to an appointment being scheduled.     Managing Your Hypertension Hypertension is commonly called high blood pressure. This is when the force of your blood pressing against the walls of your arteries is too strong. Arteries are blood vessels that carry blood from your heart throughout your body. Hypertension forces the heart to work harder to pump blood, and may cause the arteries to become narrow or stiff. Having untreated or uncontrolled hypertension can cause heart attack, stroke, kidney disease, and other problems. What are blood pressure readings? A blood pressure reading consists of a higher number over a lower number. Ideally, your blood pressure should be below 120/80. The first ("top") number is called the systolic pressure. It is a measure of the pressure in your arteries as your heart beats. The second ("bottom") number is called the diastolic pressure. It is a measure of the pressure in your arteries as the heart relaxes. What does my blood pressure reading mean? Blood pressure is classified into four stages. Based on your blood pressure reading, your health care provider may use the following stages to determine what type of treatment you need, if any. Systolic pressure and  diastolic pressure are measured in a unit called mm Hg. Normal  Systolic pressure: below 829.  Diastolic pressure: below 80. Elevated  Systolic pressure: 937-169.  Diastolic pressure: below 80. Hypertension stage 1  Systolic pressure: 678-938.  Diastolic pressure: 10-17. Hypertension stage 2  Systolic pressure: 510 or above.  Diastolic pressure: 90 or above. What health risks are associated with hypertension? Managing your hypertension is an important responsibility. Uncontrolled hypertension can lead to:  A heart attack.  A stroke.  A weakened blood vessel (aneurysm).  Heart failure.  Kidney damage.  Eye damage.  Metabolic syndrome.  Memory and concentration problems. What changes can I make to manage my hypertension? Hypertension can be managed by making lifestyle changes and possibly by taking medicines. Your health care provider will help you make a plan to bring your blood pressure within a normal range. Eating and drinking   Eat a diet that is high in fiber and potassium, and low in salt (sodium), added sugar, and fat. An example eating plan is called the DASH (Dietary Approaches to Stop Hypertension) diet. To eat this way: ? Eat plenty of fresh fruits and vegetables. Try to fill half of your plate at each meal with fruits and vegetables. ? Eat whole grains, such as whole wheat pasta, brown rice, or whole grain bread. Fill about one quarter of your plate with whole grains. ? Eat low-fat diary products. ? Avoid fatty cuts of meat, processed or cured meats, and poultry with skin. Fill about one quarter of your plate with lean proteins such as fish, chicken without skin, beans, eggs, and  tofu. ? Avoid premade and processed foods. These tend to be higher in sodium, added sugar, and fat.  Reduce your daily sodium intake. Most people with hypertension should eat less than 1,500 mg of sodium a day.  Limit alcohol intake to no more than 1 drink a day for nonpregnant  women and 2 drinks a day for men. One drink equals 12 oz of beer, 5 oz of wine, or 1 oz of hard liquor. Lifestyle  Work with your health care provider to maintain a healthy body weight, or to lose weight. Ask what an ideal weight is for you.  Get at least 30 minutes of exercise that causes your heart to beat faster (aerobic exercise) most days of the week. Activities may include walking, swimming, or biking.  Include exercise to strengthen your muscles (resistance exercise), such as weight lifting, as part of your weekly exercise routine. Try to do these types of exercises for 30 minutes at least 3 days a week.  Do not use any products that contain nicotine or tobacco, such as cigarettes and e-cigarettes. If you need help quitting, ask your health care provider.  Control any long-term (chronic) conditions you have, such as high cholesterol or diabetes. Monitoring  Monitor your blood pressure at home as told by your health care provider. Your personal target blood pressure may vary depending on your medical conditions, your age, and other factors.  Have your blood pressure checked regularly, as often as told by your health care provider. Working with your health care provider  Review all the medicines you take with your health care provider because there may be side effects or interactions.  Talk with your health care provider about your diet, exercise habits, and other lifestyle factors that may be contributing to hypertension.  Visit your health care provider regularly. Your health care provider can help you create and adjust your plan for managing hypertension. Will I need medicine to control my blood pressure? Your health care provider may prescribe medicine if lifestyle changes are not enough to get your blood pressure under control, and if:  Your systolic blood pressure is 130 or higher.  Your diastolic blood pressure is 80 or higher. Take medicines only as told by your health  care provider. Follow the directions carefully. Blood pressure medicines must be taken as prescribed. The medicine does not work as well when you skip doses. Skipping doses also puts you at risk for problems. Contact a health care provider if:  You think you are having a reaction to medicines you have taken.  You have repeated (recurrent) headaches.  You feel dizzy.  You have swelling in your ankles.  You have trouble with your vision. Get help right away if:  You develop a severe headache or confusion.  You have unusual weakness or numbness, or you feel faint.  You have severe pain in your chest or abdomen.  You vomit repeatedly.  You have trouble breathing. Summary  Hypertension is when the force of blood pumping through your arteries is too strong. If this condition is not controlled, it may put you at risk for serious complications.  Your personal target blood pressure may vary depending on your medical conditions, your age, and other factors. For most people, a normal blood pressure is less than 120/80.  Hypertension is managed by lifestyle changes, medicines, or both. Lifestyle changes include weight loss, eating a healthy, low-sodium diet, exercising more, and limiting alcohol. This information is not intended to replace advice given to  you by your health care provider. Make sure you discuss any questions you have with your health care provider. Document Revised: 12/10/2018 Document Reviewed: 07/16/2016 Elsevier Patient Education  [place].

## 2020-04-04 NOTE — Progress Notes (Signed)
Subjective:    Patient ID: Lorraine Sims, female    DOB: 1934-10-29, 85 y.o.   MRN: [id number]  CC: Lorraine Sims is a 85 y.o. female who presents today for follow up.   HPI: Complains of BL leg cramping for last 8 weeks, worsened past one week. Feels that onset has been gradual and just become more noticeable. Pain in the anterior as well front of BL shins.  She describes the pain in her shin as worse with activity such as walking.  Improves when she is resting. Started prednisone 5mg  ( old rx)  4 days ago. This morning walked and leg pain has improved. Notes that 2 months ago, she took 9 days of 2.5mg  prednisone ( old rx) with complete resolution of symptoms.    No fever, numbness, low back pain, trouble urinating, or having bowel movement.  No tick bites. No falls or injury.   Does yoga and stretches 5-6 days per week.   No cp, sob.   PMR-Diagnosed in [place].   Has upcoming appt with rheumatology, Dr Posey Pronto ( since Dr Meda Coffee as left).  No h/o htn. BP is high 'in the doctor's office.'  Usually around 140 ( doesn't recall DBP)     Following with endocrinology for osteoporosis, pending approval for Reclast  HISTORY:  Past Medical History:  Diagnosis Date  . History of shingles   . Hypertension    pt denies  . PMR (polymyalgia rheumatica) (HCC)    Past Surgical History:  Procedure Laterality Date  . BREAST BIOPSY Right 1990   neg  . CARPAL TUNNEL RELEASE Bilateral    Family History  Problem Relation Age of Onset  . Lung cancer Mother   . Hypertension Mother   . Throat cancer Father   . Diabetes Neg Hx   . Stroke Neg Hx   . Breast cancer Neg Hx     Allergies: Patient has no known allergies. Current Outpatient Medications on File Prior to Visit  Medication Sig Dispense Refill  . acetaminophen (TYLENOL) 500 MG tablet Take 500 mg by mouth every 12 (twelve) hours as needed.    Marland Kitchen ascorbic acid (VITAMIN C) 1000 MG tablet Take by mouth.    . calcium-vitamin D (OSCAL  WITH D) 500-200 MG-UNIT tablet Take 1 tablet by mouth 2 (two) times daily.    . predniSONE (DELTASONE) 5 MG tablet Take 5 mg by mouth daily with breakfast.     No current facility-administered medications on file prior to visit.    Social History   Tobacco Use  . Smoking status: Former Research scientist (life sciences)  . Smokeless tobacco: Never Used  Substance Use Topics  . Alcohol use: Yes    Comment: daily--wine  . Drug use: No    Review of Systems  Constitutional: Negative for chills and fever.  Respiratory: Negative for cough.   Cardiovascular: Negative for chest pain and palpitations.  Gastrointestinal: Negative for nausea and vomiting.  Musculoskeletal: Positive for arthralgias. Negative for back pain and joint swelling.  Neurological: Negative for numbness.      Objective:    BP (!) 180/88 (BP Location: Left Arm, Patient Position: Sitting)   Pulse 86   Temp 98.3 F (36.8 C)   Ht 5' 2.01" (1.575 m)   Wt 130 lb 6.4 oz (59.1 kg)   SpO2 98%   BMI 23.84 kg/m  BP Readings from Last 3 Encounters:  04/04/20 (!) 180/88  08/18/18 120/76  05/07/18 (!) 159/68   Wt Readings from Last 3  Encounters:  04/04/20 130 lb 6.4 oz (59.1 kg)  03/18/19 127 lb 12.8 oz (58 kg)  08/18/18 127 lb 12.8 oz (58 kg)    Physical Exam Vitals reviewed.  Constitutional:      Appearance: She is well-developed.  Eyes:     Conjunctiva/sclera: Conjunctivae normal.  Cardiovascular:     Rate and Rhythm: Normal rate and regular rhythm.     Pulses: Normal pulses.     Heart sounds: Normal heart sounds.  Pulmonary:     Effort: Pulmonary effort is normal.     Breath sounds: Normal breath sounds. No wheezing, rhonchi or rales.  Musculoskeletal:     Lumbar back: No swelling, edema, spasms, tenderness or bony tenderness. Normal range of motion.     Right upper leg: No swelling or tenderness.     Left upper leg: No swelling or tenderness.     Right lower leg: No edema.     Left lower leg: No edema.     Comments: Full  range of motion with flexion, tension, lateral side bends. No bony tenderness. No pain, numbness, tingling elicited with single leg raise bilaterally.  Bilateral Hips: No limp or waddling gait. Full ROM with flexion and hip rotation in flexion.    No pain of lateral hip with  (flexion-abduction-external rotation) test.   No pain with deep palpation of greater trochanter.    Skin:    General: Skin is warm and dry.  Neurological:     Mental Status: She is alert.     Sensory: No sensory deficit.     Deep Tendon Reflexes:     Reflex Scores:      Patellar reflexes are 2+ on the right side and 2+ on the left side.    Comments: Sensation and strength intact bilateral lower extremities.  Psychiatric:        Speech: Speech normal.        Behavior: Behavior normal.        Thought Content: Thought content normal.        Assessment & Plan:   Problem List Items Addressed This Visit      Cardiovascular and Mediastinum   Essential hypertension - Primary    Elevated today.  Reassured by normal neurologic exam and absence of signs or symptoms suggest hypertensive urgency or emergency at this time.  Upon review of her blood pressures , patient had had intermittent elevated blood pressures.  Certainly today being on prednisone I think could be contributing to this.  Advised her to wean off by taking half tab, 2.5 mg, tomorrow and then stop altogether.  I opted to start  small dose of amlodipine 2.5 mg .  Patient was very vigilant in regards to blood pressure and keep a log at home.      Relevant Medications   amLODipine (NORVASC) 2.5 MG tablet     Other   Leg pain    Bilateral.  Reassured by exam today.  Discussed differentials including intermittent claudication, degenerative disc disease/stenosis.  Pending x-ray and vascular consult.  Close follow      Relevant Orders   DG Lumbar Spine Complete   Ambulatory referral to Vascular Surgery       I have discontinued Lorraine Sims's  Potassium and mupirocin ointment. I am also having her start on amLODipine. Additionally, I am having her maintain her calcium-vitamin D, acetaminophen, ascorbic acid, and predniSONE.   Meds ordered this encounter  Medications  . amLODipine (NORVASC) 2.5 MG tablet  Sig: Take 1 tablet (2.5 mg total) by mouth daily.    Dispense:  90 tablet    Refill:  3    Order Specific Question:   Supervising Provider    Answer:   Crecencio Mc [[id number]]    Return precautions given.   Risks, benefits, and alternatives of the medications and treatment plan prescribed today were discussed, and patient expressed understanding.   Education regarding symptom management and diagnosis given to patient on AVS.  Continue to follow with Burnard Hawthorne, FNP for routine health maintenance.   Lorraine Sims and I agreed with plan.   Mable Paris, FNP

## 2020-04-04 NOTE — Assessment & Plan Note (Signed)
Elevated today.  Reassured by normal neurologic exam and absence of signs or symptoms suggest hypertensive urgency or emergency at this time.  Upon review of her blood pressures , patient had had intermittent elevated blood pressures.  Certainly today being on prednisone I think could be contributing to this.  Advised her to wean off by taking half tab, 2.5 mg, tomorrow and then stop altogether.  I opted to start  small dose of amlodipine 2.5 mg .  Patient was very vigilant in regards to blood pressure and keep a log at home.

## 2020-04-06 ENCOUNTER — Encounter: Payer: Self-pay | Admitting: Family

## 2020-04-06 DIAGNOSIS — I700 Atherosclerosis of aorta: Secondary | ICD-10-CM | POA: Insufficient documentation

## 2020-04-20 DIAGNOSIS — R102 Pelvic and perineal pain: Secondary | ICD-10-CM | POA: Diagnosis not present

## 2020-04-20 DIAGNOSIS — R1032 Left lower quadrant pain: Secondary | ICD-10-CM | POA: Diagnosis not present

## 2020-04-20 DIAGNOSIS — R350 Frequency of micturition: Secondary | ICD-10-CM | POA: Diagnosis not present

## 2020-04-20 DIAGNOSIS — R3915 Urgency of urination: Secondary | ICD-10-CM | POA: Diagnosis not present

## 2020-04-24 DIAGNOSIS — M47817 Spondylosis without myelopathy or radiculopathy, lumbosacral region: Secondary | ICD-10-CM | POA: Diagnosis not present

## 2020-04-24 DIAGNOSIS — M47816 Spondylosis without myelopathy or radiculopathy, lumbar region: Secondary | ICD-10-CM | POA: Diagnosis not present

## 2020-04-24 DIAGNOSIS — M25552 Pain in left hip: Secondary | ICD-10-CM | POA: Diagnosis not present

## 2020-04-24 DIAGNOSIS — M8588 Other specified disorders of bone density and structure, other site: Secondary | ICD-10-CM | POA: Diagnosis not present

## 2020-04-24 DIAGNOSIS — M1612 Unilateral primary osteoarthritis, left hip: Secondary | ICD-10-CM | POA: Diagnosis not present

## 2020-04-26 DIAGNOSIS — M810 Age-related osteoporosis without current pathological fracture: Secondary | ICD-10-CM | POA: Diagnosis not present

## 2020-05-01 ENCOUNTER — Other Ambulatory Visit: Payer: Self-pay

## 2020-05-01 ENCOUNTER — Ambulatory Visit (INDEPENDENT_AMBULATORY_CARE_PROVIDER_SITE_OTHER): Payer: Medicare HMO | Admitting: Vascular Surgery

## 2020-05-01 ENCOUNTER — Encounter (INDEPENDENT_AMBULATORY_CARE_PROVIDER_SITE_OTHER): Payer: Self-pay | Admitting: Vascular Surgery

## 2020-05-01 VITALS — BP 157/75 | HR 99 | Ht 62.0 in | Wt 132.0 lb

## 2020-05-01 DIAGNOSIS — M79604 Pain in right leg: Secondary | ICD-10-CM | POA: Diagnosis not present

## 2020-05-01 DIAGNOSIS — I10 Essential (primary) hypertension: Secondary | ICD-10-CM

## 2020-05-01 DIAGNOSIS — I700 Atherosclerosis of aorta: Secondary | ICD-10-CM

## 2020-05-01 DIAGNOSIS — M79605 Pain in left leg: Secondary | ICD-10-CM | POA: Diagnosis not present

## 2020-05-01 NOTE — Assessment & Plan Note (Signed)
The patient describes symptoms of claudication of both lower extremities.  We spent a long time with her and her daughter today discussing the pathophysiology and natural history of both arterial insufficiency causing claudication as well as neurogenic claudication.  She certainly could have either and potentially both.  Her plain x-ray is indicative of significant peripheral arterial disease but it does not discern any flow limitation.  I would recommend aortoiliac duplex in the near future as well as ABIs to be done for further assessment of her vascular disease.  I have discussed that if her arterial work-up shows good flow, further evaluation and treatment for neurogenic claudication would be recommended.  We will see her back in the near future with noninvasive studies.  I would recommend an aspirin 81 mg daily.  Her primary care physician had advised statin therapy and this is reasonable and helpful as well.

## 2020-05-01 NOTE — Progress Notes (Signed)
Patient ID: Lorraine Sims, female   DOB: 28-Feb-1935, 85 y.o.   MRN: [id number]  Chief Complaint  Patient presents with  . New Patient (Initial Visit)    Arnett  BLE pain    HPI Lorraine Sims is a 85 y.o. female.  I am asked to see the patient by M. Arnett for evaluation of leg pain and possible claudication.  Patient reports several months of worsening left leg pain with activity.  Both her legs get weak with walking and feel heavy.  The pain is progressed on the left side now to bother her a very short distances.  She has previously been told she had significant aortic calcifications when she had plain x-rays done to evaluate her back about a month ago.  I have independently reviewed these plain x-rays and there is fairly extensive aortoiliac calcification although obviously flow limitation cannot be discerned from the study.  She is also getting evaluation and potential treatment for back issues.  That has been a relatively recent onset.  The discomfort and tiredness in her legs has been a gradual process over years but most severe over the past few months.  No open wounds or infection.  No fevers or chills.  There is no clear cause or inciting event that started the symptoms.  She does notice that recently she has started taking turmeric and that seems to have helped her symptoms somewhat.   Past Medical History:  Diagnosis Date  . Calcification of abdominal aorta (HCC)   . History of shingles   . Hypertension    pt denies  . PMR (polymyalgia rheumatica) (HCC)     Past Surgical History:  Procedure Laterality Date  . BREAST BIOPSY Right 1990   neg  . CARPAL TUNNEL RELEASE Bilateral      Family History  Problem Relation Age of Onset  . Lung cancer Mother   . Hypertension Mother   . Throat cancer Father   . Diabetes Neg Hx   . Stroke Neg Hx   . Breast cancer Neg Hx      Social History   Tobacco Use  . Smoking status: Former Research scientist (life sciences)  . Smokeless tobacco: Never Used   Substance Use Topics  . Alcohol use: Yes    Comment: daily--wine  . Drug use: No    No Known Allergies  Current Outpatient Medications  Medication Sig Dispense Refill  . acetaminophen (TYLENOL) 500 MG tablet Take 500 mg by mouth every 12 (twelve) hours as needed.    Marland Kitchen amLODipine (NORVASC) 2.5 MG tablet Take 1 tablet (2.5 mg total) by mouth daily. 90 tablet 3  . ascorbic acid (VITAMIN C) 1000 MG tablet Take by mouth.    . calcium-vitamin D (OSCAL WITH D) 500-200 MG-UNIT tablet Take 1 tablet by mouth 2 (two) times daily.    . Turmeric (CURCUMIN 95) 500 MG CAPS Take by mouth.    . predniSONE (DELTASONE) 5 MG tablet Take 5 mg by mouth daily with breakfast. (Patient not taking: Reported on 05/01/2020)     No current facility-administered medications for this visit.      REVIEW OF SYSTEMS (Negative unless checked)  Constitutional: [] Weight loss  [] Fever  [] Chills Cardiac: [] Chest pain   [] Chest pressure   [] Palpitations   [] Shortness of breath when laying flat   [] Shortness of breath at rest   [] Shortness of breath with exertion. Vascular:  [x] Pain in legs with walking   [x] Pain in legs at rest   [] Pain in  legs when laying flat   [x] Claudication   [] Pain in feet when walking  [] Pain in feet at rest  [] Pain in feet when laying flat   [] History of DVT   [] Phlebitis   [] Swelling in legs   [] Varicose veins   [] Non-healing ulcers Pulmonary:   [] Uses home oxygen   [] Productive cough   [] Hemoptysis   [] Wheeze  [] COPD   [] Asthma Neurologic:  [] Dizziness  [] Blackouts   [] Seizures   [] History of stroke   [] History of TIA  [] Aphasia   [] Temporary blindness   [] Dysphagia   [] Weakness or numbness in arms   [] Weakness or numbness in legs Musculoskeletal:  [x] Arthritis   [] Joint swelling   [] Joint pain   [x] Low back pain Hematologic:  [] Easy bruising  [] Easy bleeding   [] Hypercoagulable state   [] Anemic  [] Hepatitis Gastrointestinal:  [] Blood in stool   [] Vomiting blood  [] Gastroesophageal  reflux/heartburn   [] Abdominal pain Genitourinary:  [] Chronic kidney disease   [] Difficult urination  [] Frequent urination  [] Burning with urination   [] Hematuria Skin:  [] Rashes   [] Ulcers   [] Wounds Psychological:  [] History of anxiety   []  History of major depression.    Physical Exam BP (!) 157/75   Pulse 99   Ht 5\' 2"  (1.575 m)   Wt 132 lb (59.9 kg)   BMI 24.14 kg/m  Gen:  WD/WN, NAD.  Appears younger than stated age Head: /AT, No temporalis wasting.  Ear/Nose/Throat: Hearing grossly intact, nares w/o erythema or drainage, oropharynx w/o Erythema/Exudate Eyes: Conjunctiva clear, sclera non-icteric  Neck: trachea midline.  No JVD.  Pulmonary:  Good air movement, respirations not labored, no use of accessory muscles  Cardiac: RRR, no JVD Vascular:  Vessel Right Left  Radial Palpable Palpable                          DP  1+  1+  PT  2+  1+   Gastrointestinal:. No masses, surgical incisions, or scars. Musculoskeletal: M/S 5/5 throughout.  Extremities without ischemic changes.  No deformity or atrophy.  Diffuse varicosities a little more on the left than the right.  No significant edema. Neurologic: Sensation grossly intact in extremities.  Symmetrical.  Speech is fluent. Motor exam as listed above. Psychiatric: Judgment intact, Mood & affect appropriate for pt's clinical situation. Dermatologic: No rashes or ulcers noted.  No cellulitis or open wounds.    Radiology DG Lumbar Spine Complete  Result Date: 04/04/2020 CLINICAL DATA:  85 year old female with leg pain. EXAM: LUMBAR SPINE - COMPLETE 4+ VIEW COMPARISON:  None. FINDINGS: Five lumbar type vertebra. There is no acute fracture or subluxation of the lumbar spine. The bones are osteopenic. There is grade 1 L4-L5 anterolisthesis. There is lower lumbar facet arthropathy. Advanced atherosclerotic calcification of the abdominal aorta. The soft tissues are unremarkable. IMPRESSION: 1. No acute fracture or subluxation of  the lumbar spine. 2. Osteopenia and degenerative changes. Electronically Signed   By: Anner Crete M.D.   On: 04/04/2020 19:16    Labs No results found for this or any previous visit (from the past 2160 hour(s)).  Assessment/Plan:  Essential hypertension blood pressure control important in reducing the progression of atherosclerotic disease. On appropriate oral medications.   Atherosclerosis of aorta ([place]) Fairly significant calcification of the aortoiliac segments.  Flow assessment will be made in the next several weeks.  Would recommend the addition of a baby aspirin daily and she has previously been advised to start statin  therapy as well.  Leg pain The patient describes symptoms of claudication of both lower extremities.  We spent a long time with her and her daughter today discussing the pathophysiology and natural history of both arterial insufficiency causing claudication as well as neurogenic claudication.  She certainly could have either and potentially both.  Her plain x-ray is indicative of significant peripheral arterial disease but it does not discern any flow limitation.  I would recommend aortoiliac duplex in the near future as well as ABIs to be done for further assessment of her vascular disease.  I have discussed that if her arterial work-up shows good flow, further evaluation and treatment for neurogenic claudication would be recommended.  We will see her back in the near future with noninvasive studies.  I would recommend an aspirin 81 mg daily.  Her primary care physician had advised statin therapy and this is reasonable and helpful as well.      Leotis Pain 05/01/2020, 3:53 PM   This note was created with Dragon medical transcription system.  Any errors from dictation are unintentional.

## 2020-05-01 NOTE — Assessment & Plan Note (Signed)
blood pressure control important in reducing the progression of atherosclerotic disease. On appropriate oral medications.  

## 2020-05-01 NOTE — Patient Instructions (Signed)
Peripheral Vascular Disease  Peripheral vascular disease (PVD) is a disease of the blood vessels that are not part of your heart and brain. A simple term for PVD is poor circulation. In most cases, PVD narrows the blood vessels that carry blood from your heart to the rest of your body. This can reduce the supply of blood to your arms, legs, and internal organs, like your stomach or kidneys. However, PVD most often affects a person's lower legs and feet. Without treatment, PVD tends to get worse. PVD can also lead to acute ischemic limb. This is when an arm or leg suddenly cannot get enough blood. This is a medical emergency. Follow these instructions at home: Lifestyle  Do not use any products that contain nicotine or tobacco, such as cigarettes and e-cigarettes. If you need help quitting, ask your doctor.  Lose weight if you are overweight. Or, stay at a healthy weight as told by your doctor.  Eat a diet that is low in fat and cholesterol. If you need help, ask your doctor.  Exercise regularly. Ask your doctor for activities that are right for you. General instructions  Take over-the-counter and prescription medicines only as told by your doctor.  Take good care of your feet: ? Wear comfortable shoes that fit well. ? Check your feet often for any cuts or sores.  Keep all follow-up visits as told by your doctor This is important. Contact a doctor if:  You have cramps in your legs when you walk.  You have leg pain when you are at rest.  You have coldness in a leg or foot.  Your skin changes.  You are unable to get or have an erection (erectile dysfunction).  You have cuts or sores on your feet that do not heal. Get help right away if:  Your arm or leg turns cold, numb, and blue.  Your arms or legs become red, warm, swollen, painful, or numb.  You have chest pain.  You have trouble breathing.  You suddenly have weakness in your face, arm, or leg.  You become very  confused or you cannot speak.  You suddenly have a very bad headache.  You suddenly cannot see. Summary  Peripheral vascular disease (PVD) is a disease of the blood vessels.  A simple term for PVD is poor circulation. Without treatment, PVD tends to get worse.  Treatment may include exercise, low fat and low cholesterol diet, and quitting smoking. This information is not intended to replace advice given to you by your health care provider. Make sure you discuss any questions you have with your health care provider. Document Revised: 07/31/2017 Document Reviewed: 09/25/2016 Elsevier Patient Education  2020 [employer]  

## 2020-05-01 NOTE — Assessment & Plan Note (Signed)
Fairly significant calcification of the aortoiliac segments.  Flow assessment will be made in the next several weeks.  Would recommend the addition of a baby aspirin daily and she has previously been advised to start statin therapy as well.

## 2020-05-02 ENCOUNTER — Ambulatory Visit (INDEPENDENT_AMBULATORY_CARE_PROVIDER_SITE_OTHER): Payer: Medicare HMO

## 2020-05-02 ENCOUNTER — Encounter (INDEPENDENT_AMBULATORY_CARE_PROVIDER_SITE_OTHER): Payer: Self-pay | Admitting: Nurse Practitioner

## 2020-05-02 ENCOUNTER — Ambulatory Visit (INDEPENDENT_AMBULATORY_CARE_PROVIDER_SITE_OTHER): Payer: Medicare HMO | Admitting: Nurse Practitioner

## 2020-05-02 VITALS — BP 137/80 | HR 93 | Resp 16 | Wt 129.6 lb

## 2020-05-02 DIAGNOSIS — I739 Peripheral vascular disease, unspecified: Secondary | ICD-10-CM | POA: Diagnosis not present

## 2020-05-02 DIAGNOSIS — M79604 Pain in right leg: Secondary | ICD-10-CM

## 2020-05-02 DIAGNOSIS — I700 Atherosclerosis of aorta: Secondary | ICD-10-CM | POA: Diagnosis not present

## 2020-05-02 DIAGNOSIS — I10 Essential (primary) hypertension: Secondary | ICD-10-CM | POA: Diagnosis not present

## 2020-05-02 DIAGNOSIS — M79605 Pain in left leg: Secondary | ICD-10-CM

## 2020-05-03 ENCOUNTER — Telehealth (INDEPENDENT_AMBULATORY_CARE_PROVIDER_SITE_OTHER): Payer: Self-pay

## 2020-05-03 NOTE — Telephone Encounter (Signed)
I attempted to contact the patient by calling home and mobile numbers and a message was left for a return call.

## 2020-05-07 ENCOUNTER — Encounter (INDEPENDENT_AMBULATORY_CARE_PROVIDER_SITE_OTHER): Payer: Self-pay | Admitting: Nurse Practitioner

## 2020-05-07 NOTE — Progress Notes (Signed)
Subjective:    Patient ID: Lorraine Sims, female    DOB: [birth year], 85 y.o.   MRN: [id number] Chief Complaint  Patient presents with  . Follow-up    ultrasound follow up    Lorraine Sims is a 85 y.o. female.  The patient returns today for noninvasive studies regarding evaluation of leg pain and possible claudication.  The patient notes that she has had worsening left leg pain with activity.  The patient notes that she walked for extended periods however noticed that she has been unable to walk for long distances without feeling as if her legs get weak with pain and heaviness.  She notes that left side leaves her unable to walk very far.  She previously had plain x-rays done and there were aortic calcifications noted.  The patient also has been evaluated for possible back issues.  This is relatively new.  However the pain in the patient's legs has been ongoing for several months.  She denies any rest pain like symptoms.  She denies any fever, chills, nausea, vomiting or diarrhea.  There is no open wounds or ulcerations.  There is no clear inciting event.  She also notes that she recently began taking turmeric and that has helped her symptoms somewhat until recently she began to have pain once again.  Today noninvasive studies showed an ABI of 1.12 on the right and 0.88 on the left.  The patient has triphasic tibial artery waveforms on the right with good toe waveforms.  The left lower extremity has biphasic tibial artery waveforms with dampened toe waveforms.  The patient also underwent an aortoiliac duplex which notes triphasic waveforms in the bilateral common iliac arteries.  The right external iliac transitions to biphasic waveforms whereas the left external iliac has biphasic waveforms until the distal external iliac where it transitions to monophasic waveforms.     Review of Systems  Cardiovascular:       Claudication  Musculoskeletal: Positive for arthralgias and back pain.  Neurological:  Positive for weakness.  All other systems reviewed and are negative.      Objective:   Physical Exam Vitals reviewed.  HENT:     Head: Normocephalic.  Cardiovascular:     Rate and Rhythm: Normal rate.     Pulses:          Dorsalis pedis pulses are 1+ on the right side and 2+ on the left side.       Posterior tibial pulses are 1+ on the right side and 2+ on the left side.  Pulmonary:     Effort: Pulmonary effort is normal.  Skin:    General: Skin is warm and dry.     Capillary Refill: Capillary refill takes less than 2 seconds.  Neurological:     Mental Status: She is alert and oriented to person, place, and time.  Psychiatric:        Mood and Affect: Mood normal.        Behavior: Behavior normal.        Thought Content: Thought content normal.        Judgment: Judgment normal.     BP 137/80 (BP Location: Left Arm)   Pulse 93   Resp 16   Wt 129 lb 9.6 oz (58.8 kg)   BMI 23.70 kg/m   Past Medical History:  Diagnosis Date  . Calcification of abdominal aorta (HCC)   . History of shingles   . Hypertension    pt denies  . PMR (  polymyalgia rheumatica) (HCC)     Social History   Socioeconomic History  . Marital status: Married    Spouse name: Not on file  . Number of children: Not on file  . Years of education: Not on file  . Highest education level: Not on file  Occupational History  . Not on file  Tobacco Use  . Smoking status: Former Research scientist (life sciences)  . Smokeless tobacco: Never Used  Substance and Sexual Activity  . Alcohol use: Yes    Comment: daily--wine  . Drug use: No  . Sexual activity: Yes  Other Topics Concern  . Not on file  Social History Narrative  . Not on file   Social Determinants of Health   Financial Resource Strain: Low Risk   . Difficulty of Paying Living Expenses: Not hard at all  Food Insecurity: No Food Insecurity  . Worried About Charity fundraiser in the Last Year: Never true  . Ran Out of Food in the Last Year: Never true   Transportation Needs: No Transportation Needs  . Lack of Transportation (Medical): No  . Lack of Transportation (Non-Medical): No  Physical Activity: Sufficiently Active  . Days of Exercise per Week: 4 days  . Minutes of Exercise per Session: 50 min  Stress: No Stress Concern Present  . Feeling of Stress : Not at all  Social Connections:   . Frequency of Communication with Friends and Family: Not on file  . Frequency of Social Gatherings with Friends and Family: Not on file  . Attends Religious Services: Not on file  . Active Member of Clubs or Organizations: Not on file  . Attends Archivist Meetings: Not on file  . Marital Status: Not on file  Intimate Partner Violence: Not At Risk  . Fear of Current or Ex-Partner: No  . Emotionally Abused: No  . Physically Abused: No  . Sexually Abused: No    Past Surgical History:  Procedure Laterality Date  . BREAST BIOPSY Right 1990   neg  . CARPAL TUNNEL RELEASE Bilateral     Family History  Problem Relation Age of Onset  . Lung cancer Mother   . Hypertension Mother   . Throat cancer Father   . Diabetes Neg Hx   . Stroke Neg Hx   . Breast cancer Neg Hx     No Known Allergies     Assessment & Plan:   1. PAD (peripheral artery disease) (HCC) Recommend:  The patient has experienced increased symptoms and is now describing lifestyle limiting claudication noninvasive studies were also reviewed.  Dr. Lucky Cowboy in given the patient's claudication-like symptoms in addition to the studies, monitor for with angiogram would be in patient's best interest..   Given the severity of the patient's lower extremity symptoms the patient should undergo angiography and intervention.  Risk and benefits were reviewed the patient.  Indications for the procedure were reviewed.  All questions were answered, the patient agrees to proceed.   The patient should continue walking and begin a more formal exercise program.  The patient should  continue antiplatelet therapy and aggressive treatment of the lipid abnormalities  The patient will follow up with me after the angiogram.   2. Essential hypertension Continue antihypertensive medications as already ordered, these medications have been reviewed and there are no changes at this time.    Current Outpatient Medications on File Prior to Visit  Medication Sig Dispense Refill  . acetaminophen (TYLENOL) 500 MG tablet Take 500 mg by mouth  every 12 (twelve) hours as needed.    Marland Kitchen amLODipine (NORVASC) 2.5 MG tablet Take 1 tablet (2.5 mg total) by mouth daily. 90 tablet 3  . ascorbic acid (VITAMIN C) 1000 MG tablet Take by mouth.    . calcium-vitamin D (OSCAL WITH D) 500-200 MG-UNIT tablet Take 1 tablet by mouth 2 (two) times daily.    . Turmeric (CURCUMIN 95) 500 MG CAPS Take by mouth.    . predniSONE (DELTASONE) 5 MG tablet Take 5 mg by mouth daily with breakfast. (Patient not taking: Reported on 05/01/2020)     No current facility-administered medications on file prior to visit.    There are no Patient Instructions on file for this visit. No follow-ups on file.   Kris Hartmann, NP

## 2020-05-07 NOTE — H&P (View-Only) (Signed)
Subjective:    Patient ID: Lorraine Sims, female    DOB: 11-01-1934, 85 y.o.   MRN: [id number] Chief Complaint  Patient presents with  . Follow-up    ultrasound follow up    Lorraine Sims is a 85 y.o. female.  The patient returns today for noninvasive studies regarding evaluation of leg pain and possible claudication.  The patient notes that she has had worsening left leg pain with activity.  The patient notes that she walked for extended periods however noticed that she has been unable to walk for long distances without feeling as if her legs get weak with pain and heaviness.  She notes that left side leaves her unable to walk very far.  She previously had plain x-rays done and there were aortic calcifications noted.  The patient also has been evaluated for possible back issues.  This is relatively new.  However the pain in the patient's legs has been ongoing for several months.  She denies any rest pain like symptoms.  She denies any fever, chills, nausea, vomiting or diarrhea.  There is no open wounds or ulcerations.  There is no clear inciting event.  She also notes that she recently began taking turmeric and that has helped her symptoms somewhat until recently she began to have pain once again.  Today noninvasive studies showed an ABI of 1.12 on the right and 0.88 on the left.  The patient has triphasic tibial artery waveforms on the right with good toe waveforms.  The left lower extremity has biphasic tibial artery waveforms with dampened toe waveforms.  The patient also underwent an aortoiliac duplex which notes triphasic waveforms in the bilateral common iliac arteries.  The right external iliac transitions to biphasic waveforms whereas the left external iliac has biphasic waveforms until the distal external iliac where it transitions to monophasic waveforms.     Review of Systems  Cardiovascular:       Claudication  Musculoskeletal: Positive for arthralgias and back pain.  Neurological:  Positive for weakness.  All other systems reviewed and are negative.      Objective:   Physical Exam Vitals reviewed.  HENT:     Head: Normocephalic.  Cardiovascular:     Rate and Rhythm: Normal rate.     Pulses:          Dorsalis pedis pulses are 1+ on the right side and 2+ on the left side.       Posterior tibial pulses are 1+ on the right side and 2+ on the left side.  Pulmonary:     Effort: Pulmonary effort is normal.  Skin:    General: Skin is warm and dry.     Capillary Refill: Capillary refill takes less than 2 seconds.  Neurological:     Mental Status: She is alert and oriented to person, place, and time.  Psychiatric:        Mood and Affect: Mood normal.        Behavior: Behavior normal.        Thought Content: Thought content normal.        Judgment: Judgment normal.     BP 137/80 (BP Location: Left Arm)   Pulse 93   Resp 16   Wt 129 lb 9.6 oz (58.8 kg)   BMI 23.70 kg/m   Past Medical History:  Diagnosis Date  . Calcification of abdominal aorta (HCC)   . History of shingles   . Hypertension    pt denies  . PMR (  polymyalgia rheumatica) (HCC)     Social History   Socioeconomic History  . Marital status: Married    Spouse name: Not on file  . Number of children: Not on file  . Years of education: Not on file  . Highest education level: Not on file  Occupational History  . Not on file  Tobacco Use  . Smoking status: Former Research scientist (life sciences)  . Smokeless tobacco: Never Used  Substance and Sexual Activity  . Alcohol use: Yes    Comment: daily--wine  . Drug use: No  . Sexual activity: Yes  Other Topics Concern  . Not on file  Social History Narrative  . Not on file   Social Determinants of Health   Financial Resource Strain: Low Risk   . Difficulty of Paying Living Expenses: Not hard at all  Food Insecurity: No Food Insecurity  . Worried About Charity fundraiser in the Last Year: Never true  . Ran Out of Food in the Last Year: Never true    Transportation Needs: No Transportation Needs  . Lack of Transportation (Medical): No  . Lack of Transportation (Non-Medical): No  Physical Activity: Sufficiently Active  . Days of Exercise per Week: 4 days  . Minutes of Exercise per Session: 50 min  Stress: No Stress Concern Present  . Feeling of Stress : Not at all  Social Connections:   . Frequency of Communication with Friends and Family: Not on file  . Frequency of Social Gatherings with Friends and Family: Not on file  . Attends Religious Services: Not on file  . Active Member of Clubs or Organizations: Not on file  . Attends Archivist Meetings: Not on file  . Marital Status: Not on file  Intimate Partner Violence: Not At Risk  . Fear of Current or Ex-Partner: No  . Emotionally Abused: No  . Physically Abused: No  . Sexually Abused: No    Past Surgical History:  Procedure Laterality Date  . BREAST BIOPSY Right 1990   neg  . CARPAL TUNNEL RELEASE Bilateral     Family History  Problem Relation Age of Onset  . Lung cancer Mother   . Hypertension Mother   . Throat cancer Father   . Diabetes Neg Hx   . Stroke Neg Hx   . Breast cancer Neg Hx     No Known Allergies     Assessment & Plan:   1. PAD (peripheral artery disease) (HCC) Recommend:  The patient has experienced increased symptoms and is now describing lifestyle limiting claudication noninvasive studies were also reviewed.  Dr. Lucky Cowboy in given the patient's claudication-like symptoms in addition to the studies, monitor for with angiogram would be in patient's best interest..   Given the severity of the patient's lower extremity symptoms the patient should undergo angiography and intervention.  Risk and benefits were reviewed the patient.  Indications for the procedure were reviewed.  All questions were answered, the patient agrees to proceed.   The patient should continue walking and begin a more formal exercise program.  The patient should  continue antiplatelet therapy and aggressive treatment of the lipid abnormalities  The patient will follow up with me after the angiogram.   2. Essential hypertension Continue antihypertensive medications as already ordered, these medications have been reviewed and there are no changes at this time.    Current Outpatient Medications on File Prior to Visit  Medication Sig Dispense Refill  . acetaminophen (TYLENOL) 500 MG tablet Take 500 mg by  mouth every 12 (twelve) hours as needed.    Marland Kitchen amLODipine (NORVASC) 2.5 MG tablet Take 1 tablet (2.5 mg total) by mouth daily. 90 tablet 3  . ascorbic acid (VITAMIN C) 1000 MG tablet Take by mouth.    . calcium-vitamin D (OSCAL WITH D) 500-200 MG-UNIT tablet Take 1 tablet by mouth 2 (two) times daily.    . Turmeric (CURCUMIN 95) 500 MG CAPS Take by mouth.    . predniSONE (DELTASONE) 5 MG tablet Take 5 mg by mouth daily with breakfast. (Patient not taking: Reported on 05/01/2020)     No current facility-administered medications on file prior to visit.    There are no Patient Instructions on file for this visit. No follow-ups on file.   Kris Hartmann, NP

## 2020-05-08 NOTE — Telephone Encounter (Signed)
Spoke with the patient and she is scheduled with Dr. Lucky Cowboy for a LLE angio on 05/14/20 with a 9:45 am arrival time to the [facility]. Covid testing is on 05/10/20 between 8-1 pm at the [facility]. Pre-procedure instruction were discussed and will be mailed.

## 2020-05-10 ENCOUNTER — Other Ambulatory Visit
Admission: RE | Admit: 2020-05-10 | Discharge: 2020-05-10 | Disposition: A | Discharge status: Home / Self Care | Payer: Medicare HMO | Source: Ambulatory Visit | Attending: Vascular Surgery | Admitting: Vascular Surgery

## 2020-05-10 ENCOUNTER — Other Ambulatory Visit: Payer: Self-pay

## 2020-05-10 DIAGNOSIS — Z01812 Encounter for preprocedural laboratory examination: Secondary | ICD-10-CM | POA: Diagnosis not present

## 2020-05-10 DIAGNOSIS — Z20822 Contact with and (suspected) exposure to covid-19: Secondary | ICD-10-CM | POA: Insufficient documentation

## 2020-05-11 LAB — SARS CORONAVIRUS 2 (TAT 6-24 HRS): SARS Coronavirus 2: NEGATIVE

## 2020-05-14 ENCOUNTER — Other Ambulatory Visit (INDEPENDENT_AMBULATORY_CARE_PROVIDER_SITE_OTHER): Payer: Self-pay | Admitting: Nurse Practitioner

## 2020-05-14 ENCOUNTER — Ambulatory Visit
Admission: RE | Admit: 2020-05-14 | Discharge: 2020-05-14 | Disposition: A | Discharge status: Home / Self Care | Payer: Medicare HMO | Attending: Vascular Surgery | Admitting: Vascular Surgery

## 2020-05-14 ENCOUNTER — Encounter: Payer: Self-pay | Admitting: Vascular Surgery

## 2020-05-14 ENCOUNTER — Other Ambulatory Visit: Payer: Self-pay

## 2020-05-14 ENCOUNTER — Encounter
Admission: RE | Disposition: A | Discharge status: Home / Self Care | Payer: Self-pay | Source: Home / Self Care | Attending: Vascular Surgery

## 2020-05-14 DIAGNOSIS — I70212 Atherosclerosis of native arteries of extremities with intermittent claudication, left leg: Secondary | ICD-10-CM | POA: Insufficient documentation

## 2020-05-14 DIAGNOSIS — I700 Atherosclerosis of aorta: Secondary | ICD-10-CM | POA: Insufficient documentation

## 2020-05-14 DIAGNOSIS — Z87891 Personal history of nicotine dependence: Secondary | ICD-10-CM | POA: Insufficient documentation

## 2020-05-14 DIAGNOSIS — M353 Polymyalgia rheumatica: Secondary | ICD-10-CM | POA: Insufficient documentation

## 2020-05-14 DIAGNOSIS — Z79899 Other long term (current) drug therapy: Secondary | ICD-10-CM | POA: Diagnosis not present

## 2020-05-14 DIAGNOSIS — I10 Essential (primary) hypertension: Secondary | ICD-10-CM | POA: Diagnosis not present

## 2020-05-14 DIAGNOSIS — I70219 Atherosclerosis of native arteries of extremities with intermittent claudication, unspecified extremity: Secondary | ICD-10-CM

## 2020-05-14 HISTORY — PX: LOWER EXTREMITY ANGIOGRAPHY: CATH118251

## 2020-05-14 HISTORY — DX: Other congenital malformations of spine, not associated with scoliosis: Q76.49

## 2020-05-14 LAB — CREATININE, SERUM
Creatinine, Ser: 0.61 mg/dL (ref 0.44–1.00)
GFR calc Af Amer: >60 mL/min (ref >60)
GFR calc non Af Amer: >60 mL/min (ref >60)

## 2020-05-14 LAB — BUN: BUN: 20 mg/dL (ref 8–23)

## 2020-05-14 SURGERY — LOWER EXTREMITY ANGIOGRAPHY
Anesthesia: Moderate Sedation | Site: Leg Lower | Laterality: Left

## 2020-05-14 MED ORDER — IODIXANOL 320 MG/ML IV SOLN
INTRAVENOUS | Status: DC | PRN
  Administered 2020-05-14: 50 mL via INTRA_ARTERIAL

## 2020-05-14 MED ORDER — ASPIRIN EC 81 MG PO TBEC
DELAYED_RELEASE_TABLET | ORAL | Status: AC
  Administered 2020-05-14: 81 mg via ORAL
  Filled 2020-05-14: qty 1

## 2020-05-14 MED ORDER — FAMOTIDINE 20 MG PO TABS: 40.0000 mg | ORAL_TABLET | Freq: Once | ORAL | Status: DC | PRN

## 2020-05-14 MED ORDER — SODIUM CHLORIDE 0.9% FLUSH: 3.0000 mL | INTRAVENOUS | Status: DC | PRN

## 2020-05-14 MED ORDER — CLOPIDOGREL BISULFATE 75 MG PO TABS: 75.0000 mg | ORAL_TABLET | Freq: Every day | ORAL | 11 refills | Status: AC

## 2020-05-14 MED ORDER — SODIUM CHLORIDE 0.9 % IV SOLN: INTRAVENOUS | Status: DC

## 2020-05-14 MED ORDER — FENTANYL CITRATE (PF) 100 MCG/2ML IJ SOLN
INTRAMUSCULAR | Status: DC
Start: 2020-05-14 — End: 2020-05-14
  Filled 2020-05-14: qty 2

## 2020-05-14 MED ORDER — HYDRALAZINE HCL 20 MG/ML IJ SOLN: 5.0000 mg | INTRAMUSCULAR | Status: DC | PRN

## 2020-05-14 MED ORDER — FENTANYL CITRATE (PF) 100 MCG/2ML IJ SOLN
INTRAMUSCULAR | Status: DC | PRN
Start: 2020-05-14 — End: 2020-05-14
  Administered 2020-05-14: 50 ug via INTRAVENOUS

## 2020-05-14 MED ORDER — MIDAZOLAM HCL 2 MG/ML PO SYRP: 8.0000 mg | ORAL_SOLUTION | Freq: Once | ORAL | Status: DC | PRN

## 2020-05-14 MED ORDER — HYDROMORPHONE HCL 1 MG/ML IJ SOLN: 1.0000 mg | Freq: Once | INTRAMUSCULAR | Status: DC | PRN

## 2020-05-14 MED ORDER — ASPIRIN EC 81 MG PO TBEC: 81.0000 mg | DELAYED_RELEASE_TABLET | Freq: Every day | ORAL | 2 refills | Status: AC

## 2020-05-14 MED ORDER — ATORVASTATIN CALCIUM 10 MG PO TABS: 10.0000 mg | ORAL_TABLET | Freq: Every day | ORAL | Status: DC

## 2020-05-14 MED ORDER — MIDAZOLAM HCL 2 MG/2ML IJ SOLN
INTRAMUSCULAR | Status: DC | PRN
  Administered 2020-05-14: 2 mg via INTRAVENOUS

## 2020-05-14 MED ORDER — HEPARIN SODIUM (PORCINE) 1000 UNIT/ML IJ SOLN
INTRAMUSCULAR | Status: DC | PRN
  Administered 2020-05-14: 5000 [IU] via INTRAVENOUS

## 2020-05-14 MED ORDER — ONDANSETRON HCL 4 MG/2ML IJ SOLN: 4.0000 mg | Freq: Four times a day (QID) | INTRAMUSCULAR | Status: DC | PRN

## 2020-05-14 MED ORDER — ATORVASTATIN CALCIUM 10 MG PO TABS: 10.0000 mg | ORAL_TABLET | Freq: Every day | ORAL | 11 refills | Status: AC

## 2020-05-14 MED ORDER — CLOPIDOGREL BISULFATE 75 MG PO TABS: 75.0000 mg | ORAL_TABLET | Freq: Every day | ORAL | Status: DC

## 2020-05-14 MED ORDER — ACETAMINOPHEN 325 MG PO TABS: 650.0000 mg | ORAL_TABLET | ORAL | Status: DC | PRN

## 2020-05-14 MED ORDER — MIDAZOLAM HCL 5 MG/5ML IJ SOLN
INTRAMUSCULAR | Status: AC
  Filled 2020-05-14: qty 5

## 2020-05-14 MED ORDER — CEFAZOLIN SODIUM-DEXTROSE 2-4 GM/100ML-% IV SOLN
2.0000 g | Freq: Once | INTRAVENOUS | Status: AC
  Administered 2020-05-14: 2 g via INTRAVENOUS

## 2020-05-14 MED ORDER — CEFAZOLIN SODIUM-DEXTROSE 2-4 GM/100ML-% IV SOLN
INTRAVENOUS | Status: AC
  Filled 2020-05-14: qty 100

## 2020-05-14 MED ORDER — HEPARIN SODIUM (PORCINE) 1000 UNIT/ML IJ SOLN
INTRAMUSCULAR | Status: AC
  Filled 2020-05-14: qty 1

## 2020-05-14 MED ORDER — SODIUM CHLORIDE 0.9% FLUSH: 3.0000 mL | Freq: Two times a day (BID) | INTRAVENOUS | Status: DC

## 2020-05-14 MED ORDER — METHYLPREDNISOLONE SODIUM SUCC 125 MG IJ SOLR: 125.0000 mg | Freq: Once | INTRAMUSCULAR | Status: DC | PRN

## 2020-05-14 MED ORDER — LABETALOL HCL 5 MG/ML IV SOLN: 10.0000 mg | INTRAVENOUS | Status: DC | PRN

## 2020-05-14 MED ORDER — SODIUM CHLORIDE 0.9 % IV SOLN: 250.0000 mL | INTRAVENOUS | Status: DC | PRN

## 2020-05-14 MED ORDER — DIPHENHYDRAMINE HCL 50 MG/ML IJ SOLN: 50.0000 mg | Freq: Once | INTRAMUSCULAR | Status: DC | PRN

## 2020-05-14 MED ORDER — ASPIRIN EC 81 MG PO TBEC: 81.0000 mg | DELAYED_RELEASE_TABLET | Freq: Every day | ORAL | Status: DC

## 2020-05-14 SURGICAL SUPPLY — 16 items
BALLN LUTONIX 5X220X130 (BALLOONS) ×3
BALLOON LUTONIX 5X220X130 (BALLOONS) ×1 IMPLANT
CATH ANGIO 5F PIGTAIL 65CM (CATHETERS) ×3 IMPLANT
CATH BEACON 5 .038 100 VERT TP (CATHETERS) ×3 IMPLANT
CATH ROTAREX 135 6FR (CATHETERS) ×3 IMPLANT
DEVICE PRESTO INFLATION (MISCELLANEOUS) ×3 IMPLANT
DEVICE STARCLOSE SE CLOSURE (Vascular Products) ×3 IMPLANT
GLIDEWIRE ADV .035X260CM (WIRE) ×3 IMPLANT
PACK ANGIOGRAPHY (CUSTOM PROCEDURE TRAY) ×6 IMPLANT
SHEATH ANL2 6FRX45 HC (SHEATH) ×3 IMPLANT
SHEATH BRITE TIP 5FRX11 (SHEATH) ×3 IMPLANT
STENT VIABAHN 6X150X120 (Permanent Stent) ×3 IMPLANT
SYR MEDRAD MARK 7 150ML (SYRINGE) ×3 IMPLANT
TUBING CONTRAST HIGH PRESS 72 (TUBING) ×3 IMPLANT
WIRE G V18X300CM (WIRE) ×3 IMPLANT
WIRE J 3MM .035X145CM (WIRE) ×3 IMPLANT

## 2020-05-14 NOTE — Interval H&P Note (Signed)
History and Physical Interval Note:  05/14/2020 10:10 AM  Lorraine Sims  has presented today for surgery, with the diagnosis of LT lower extremity angio  BARD   ASO w claudication Covid Sept 9.  The various methods of treatment have been discussed with the patient and family. After consideration of risks, benefits and other options for treatment, the patient has consented to  Procedure(s): LOWER EXTREMITY ANGIOGRAPHY (Left) as a surgical intervention.  The patient's history has been reviewed, patient examined, no change in status, stable for surgery.  I have reviewed the patient's chart and labs.  Questions were answered to the patient's satisfaction.     Leotis Pain

## 2020-05-14 NOTE — Discharge Instructions (Signed)
Angiogram, Care After This sheet gives you information about how to care for yourself after your procedure. Your health care provider may also give you more specific instructions. If you have problems or questions, contact your health care provider. What can I expect after the procedure? After the procedure, it is common to have bruising and tenderness at the catheter insertion area. Follow these instructions at home: Insertion site care  Follow instructions from your health care provider about how to take care of your insertion site. Make sure you: ? Wash your hands with soap and water before you change your bandage (dressing). If soap and water are not available, use hand sanitizer. ? Change your dressing as told by your health care provider. ? Leave stitches (sutures), skin glue, or adhesive strips in place. These skin closures may need to stay in place for 2 weeks or longer. If adhesive strip edges start to loosen and curl up, you may trim the loose edges. Do not remove adhesive strips completely unless your health care provider tells you to do that.  Do not take baths, swim, or use a hot tub until your health care provider approves.  You may shower 24-48 hours after the procedure or as told by your health care provider. ? Gently wash the site with plain soap and water. ? Pat the area dry with a clean towel. ? Do not rub the site. This may cause bleeding.  Do not apply powder or lotion to the site. Keep the site clean and dry.  Check your insertion site every day for signs of infection. Check for: ? Redness, swelling, or pain. ? Fluid or blood. ? Warmth. ? Pus or a bad smell. Activity  Rest as told by your health care provider, usually for 1-2 days.  Do not lift anything that is heavier than 10 lbs. (4.5 kg) or as told by your health care provider.  Do not drive for 24 hours if you were given a medicine to help you relax (sedative).  Do not drive or use heavy machinery while  taking prescription pain medicine. General instructions   Return to your normal activities as told by your health care provider, usually in about a week. Ask your health care provider what activities are safe for you.  If the catheter site starts bleeding, lie flat and put pressure on the site. If the bleeding does not stop, get help right away. This is a medical emergency.  Drink enough fluid to keep your urine clear or pale yellow. This helps flush the contrast dye from your body.  Take over-the-counter and prescription medicines only as told by your health care provider.  Keep all follow-up visits as told by your health care provider. This is important. Contact a health care provider if:  You have a fever or chills.  You have redness, swelling, or pain around your insertion site.  You have fluid or blood coming from your insertion site.  The insertion site feels warm to the touch.  You have pus or a bad smell coming from your insertion site.  You have bruising around the insertion site.  You notice blood collecting in the tissue around the catheter site (hematoma). The hematoma may be painful to the touch. Get help right away if:  You have severe pain at the catheter insertion area.  The catheter insertion area swells very fast.  The catheter insertion area is bleeding, and the bleeding does not stop when you hold steady pressure on the area.    The area near or just beyond the catheter insertion site becomes pale, cool, tingly, or numb. These symptoms may represent a serious problem that is an emergency. Do not wait to see if the symptoms will go away. Get medical help right away. Call your local emergency services (911 in the [place] Do not drive yourself to the hospital. Summary  After the procedure, it is common to have bruising and tenderness at the catheter insertion area.  After the procedure, it is important to rest and drink plenty of fluids.  Do not take baths,  swim, or use a hot tub until your health care provider says it is okay to do so. You may shower 24-48 hours after the procedure or as told by your health care provider.  If the catheter site starts bleeding, lie flat and put pressure on the site. If the bleeding does not stop, get help right away. This is a medical emergency. This information is not intended to replace advice given to you by your health care provider. Make sure you discuss any questions you have with your health care provider. Document Revised: 07/31/2017 Document Reviewed: 07/23/2016 [employer] Patient Education  2020 [employer]  

## 2020-05-14 NOTE — Op Note (Signed)
[facility]  Percutaneous Study/Intervention Procedural Note   Date of Surgery: 05/14/2020  Surgeon(s):Hagar Sadiq    Assistants:none  Pre-operative Diagnosis: PAD with claudication left lower extremity  Post-operative diagnosis:  Same  Procedure(s) Performed:             1.  Ultrasound guidance for vascular access right femoral artery             2.  Catheter placement into left SFA from right femoral approach             3.  Aortogram and selective left lower extremity angiogram             4.   Mechanical thrombectomy with the roto-Rex device in the left superficial femoral artery             5.   Percutaneous transluminal angioplasty of the left SFA with 5 mm diameter by 22 cm length Lutonix drug-coated angioplasty balloon  6.  Viabahn stent placement to the left SFA with 6 mm diameter by 15 cm length stent             7.  StarClose closure device right femoral artery  EBL: 5 cc  Contrast: 50 cc  Fluoro Time: 5.3 minutes  Moderate Conscious Sedation Time: approximately 37 minutes using 2 mg of Versed and 50 mcg of Fentanyl              Indications:  Patient is a 85 y.o.female with disabling claudication symptoms of the left leg. The patient has noninvasive study showing a reduction left ABI with diminished waveforms distally. The patient is brought in for angiography for further evaluation and potential treatment. Risks and benefits are discussed and informed consent is obtained.   Procedure:  The patient was identified and appropriate procedural time out was performed.  The patient was then placed supine on the table and prepped and draped in the usual sterile fashion. Moderate conscious sedation was administered during a face to face encounter with the patient throughout the procedure with my supervision of the RN administering medicines and monitoring the patient's vital signs, pulse oximetry, telemetry and mental status throughout from the start of the  procedure until the patient was taken to the recovery room. Ultrasound was used to evaluate the right common femoral artery.  It was patent .  A digital ultrasound image was acquired.  A Seldinger needle was used to access the right common femoral artery under direct ultrasound guidance and a permanent image was performed.  A 0.035 J wire was advanced without resistance and a 5Fr sheath was placed.  Pigtail catheter was placed into the aorta and an AP aortogram was performed. This demonstrated normal renal arteries, normal aorta, mild disease in the common iliac arteries appearing in the 25-30% range bilaterally. Pelvic obliques were done to help opacify the iliac vessels to ensure a more significant stenosis was not seen.  I then crossed the aortic bifurcation and advanced to the left femoral head and just into the proximal left SFA. Selective left lower extremity angiogram was then performed. This demonstrated a fairly diffusely diseased left proximal to mid, mid, and distal SFA with several areas of greater than 70% stenosis and in the more distal segment a greater than 90% stenosis.  The popliteal artery normalized.  There was then two-vessel runoff distally with an anterior tibial and the peroneal artery that did not appear to have focal stenosis. It was felt that it was in the  patient's best interest to proceed with intervention after these images to avoid a second procedure and a larger amount of contrast and fluoroscopy based off of the findings from the initial angiogram. The patient was systemically heparinized and a 6 [place] Ansell sheath was then placed over the [employer] wire. I then used a Kumpe catheter and the advantage wire to navigate through the stenoses and into the popliteal artery without difficulty.  After imaging showed intraluminal flow in the popliteal artery, the V 18 wire was placed and the Kumpe catheter was removed.  I elected for mechanical thrombectomy as there appeared to be  significant chronic thrombus in the left SFA lesions.  A pass was made with the roto-Rex device from the proximal left SFA down to the mid SFA and down to the distal SFA encompassing the lesions.  Care was taken and the extremely tight areas to slowly go through and back up making multiple passes.  Imaging following this showed improvement in the chronic thrombus, but still several areas of significant stenosis.  I then performed angioplasty with 5 mm diameter by 22 cm length Lutonix drug-coated angioplasty balloon inflated to 8 atm for 1 minute.  Completion imaging showed in the proximal to mid segment and then the mid to distal segment there was residual stenosis of greater than 50%.  I then elected to place a 6 mm diameter by 15 cm length Viabahn stent in the left SFA which was postdilated with a 5 mm balloon with excellent angiographic completion result and less than 15% residual stenosis. I elected to terminate the procedure. The sheath was removed and StarClose closure device was deployed in the right femoral artery with excellent hemostatic result. The patient was taken to the recovery room in stable condition having tolerated the procedure well.  Findings:               Aortogram:  normal renal arteries, normal aorta, mild disease in the common iliac arteries appearing in the 25-30% range bilaterally.             Left lower Extremity:  This demonstrated a fairly diffusely diseased left proximal to mid, mid, and distal SFA with several areas of greater than 70% stenosis and in the more distal segment a greater than 90% stenosis.  The popliteal artery normalized.  There was then two-vessel runoff distally with an anterior tibial and the peroneal artery that did not appear to have focal stenosis   Disposition: Patient was taken to the recovery room in stable condition having tolerated the procedure well.  Complications: None  Leotis Pain 05/14/2020 12:11 PM   This note was created with Dragon  Medical transcription system. Any errors in dictation are purely unintentional.

## 2020-05-15 ENCOUNTER — Telehealth (INDEPENDENT_AMBULATORY_CARE_PROVIDER_SITE_OTHER): Payer: Self-pay

## 2020-05-15 ENCOUNTER — Encounter: Payer: Self-pay | Admitting: Vascular Surgery

## 2020-05-15 NOTE — Telephone Encounter (Signed)
Patient left a message informing that she is experiencing pain since her procedure ( left le angio) on yesterday. The patient stated that pain last night was almost a 10 and today the pain is a 4, she took some Tylenol but no pain relief. I spoke with Eulogio Ditch NP she informed that pain is to expected and that Tramadol #20 take 1 tablet every 6 hours as needed for pain with no refills can be called into pharmacy. I informed the patient that I will be calling Tramadol #20 into the [facility].

## 2020-05-17 ENCOUNTER — Other Ambulatory Visit (INDEPENDENT_AMBULATORY_CARE_PROVIDER_SITE_OTHER): Payer: Self-pay | Admitting: Nurse Practitioner

## 2020-05-17 DIAGNOSIS — M79662 Pain in left lower leg: Secondary | ICD-10-CM

## 2020-05-17 DIAGNOSIS — Z95820 Peripheral vascular angioplasty status with implants and grafts: Secondary | ICD-10-CM

## 2020-05-21 ENCOUNTER — Telehealth (INDEPENDENT_AMBULATORY_CARE_PROVIDER_SITE_OTHER): Payer: Self-pay

## 2020-05-21 NOTE — Telephone Encounter (Signed)
Patient appointment will stay as schedule

## 2020-05-21 NOTE — Telephone Encounter (Signed)
We can if there is available time on the ultrasound schedule in either me or dew's schedule

## 2020-05-23 ENCOUNTER — Encounter (INDEPENDENT_AMBULATORY_CARE_PROVIDER_SITE_OTHER): Payer: Self-pay | Admitting: Nurse Practitioner

## 2020-05-23 ENCOUNTER — Ambulatory Visit (INDEPENDENT_AMBULATORY_CARE_PROVIDER_SITE_OTHER): Payer: Medicare HMO | Admitting: Nurse Practitioner

## 2020-05-23 ENCOUNTER — Ambulatory Visit (INDEPENDENT_AMBULATORY_CARE_PROVIDER_SITE_OTHER): Payer: Medicare HMO

## 2020-05-23 ENCOUNTER — Other Ambulatory Visit: Payer: Self-pay

## 2020-05-23 VITALS — BP 150/72 | HR 94 | Ht 62.0 in | Wt 131.0 lb

## 2020-05-23 DIAGNOSIS — M79662 Pain in left lower leg: Secondary | ICD-10-CM | POA: Diagnosis not present

## 2020-05-23 DIAGNOSIS — Z95820 Peripheral vascular angioplasty status with implants and grafts: Secondary | ICD-10-CM

## 2020-05-23 DIAGNOSIS — I739 Peripheral vascular disease, unspecified: Secondary | ICD-10-CM | POA: Diagnosis not present

## 2020-05-23 DIAGNOSIS — M79661 Pain in right lower leg: Secondary | ICD-10-CM

## 2020-05-23 DIAGNOSIS — I10 Essential (primary) hypertension: Secondary | ICD-10-CM | POA: Diagnosis not present

## 2020-05-23 NOTE — Progress Notes (Signed)
Subjective:    Patient ID: Lorraine Sims, female    DOB: February 04, 1935, 85 y.o.   MRN: [id number] Chief Complaint  Patient presents with  . Follow-up    U/S follow up    The patient returns to the office for followup and review status post angiogram with intervention. The patient notes improvement in the lower extremity symptoms. No interval shortening of the patient's claudication distance or rest pain symptoms. Previous wounds have now healed.  No new ulcers or wounds have occurred since the last visit.  The patient did have some swelling, pain and bruising following the procedure but that is beginning to resolve at this point  There have been no significant changes to the patient's overall health care.  The patient denies amaurosis fugax or recent TIA symptoms. There are no recent neurological changes noted. The patient denies history of DVT, PE or superficial thrombophlebitis. The patient denies recent episodes of angina or shortness of breath.   ABI's Rt=1.02 and Lt=1.07  (previous ABI's Rt= 1.12 and Lt=0.88) Duplex US of the waveforms bilaterallybilateral tibial arteries reveals triphasic waveforms with strong toe   Review of Systems  Cardiovascular: Positive for leg swelling.  Hematological: Bruises/bleeds easily.  All other systems reviewed and are negative.      Objective:   Physical Exam Vitals reviewed.  HENT:     Head: Normocephalic.  Cardiovascular:     Rate and Rhythm: Normal rate and regular rhythm.     Pulses: Normal pulses.  Pulmonary:     Effort: Pulmonary effort is normal.  Musculoskeletal:     Left lower leg: Edema present.  Skin:    Capillary Refill: Capillary refill takes less than 2 seconds.  Neurological:     Mental Status: She is alert and oriented to person, place, and time.  Psychiatric:        Mood and Affect: Mood normal.        Behavior: Behavior normal.        Thought Content: Thought content normal.        Judgment: Judgment normal.      BP (!) 150/72   Pulse 94   Ht 5\' 2"  (1.575 m)   Wt 131 lb (59.4 kg)   BMI 23.96 kg/m   Past Medical History:  Diagnosis Date  . Calcification of abdominal aorta (HCC)   . History of shingles   . Hypertension    pt denies  . PMR (polymyalgia rheumatica) (HCC)   . 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 Marital status: Married    Spouse name: Not on file  . Number of children: Not on file  . Years of education: Not on file  . Highest education level: Not on file  Occupational History  . Not on file  Tobacco Use  . Smoking status: Former Research scientist (life sciences)  . Smokeless tobacco: Never Used  . Tobacco comment: quit 30 years ago  Substance and Sexual Activity  . Alcohol use: Yes    Comment: daily--wine  . Drug use: No  . Sexual activity: Yes  Other Topics Concern  . Not on file  Social History Narrative  . Not on file   Social Determinants of Health   Financial Resource Strain: Low Risk   . Difficulty of Paying Living Expenses: Not hard at all  Food Insecurity: No Food Insecurity  . Worried About Charity fundraiser in the Last Year: Never true  .  Ran Out of Food in the Last Year: Never true  Transportation Needs: No Transportation Needs  . Lack of Transportation (Medical): No  . Lack of Transportation (Non-Medical): No  Physical Activity: Sufficiently Active  . Days of Exercise per Week: 4 days  . Minutes of Exercise per Session: 50 min  Stress: No Stress Concern Present  . Feeling of Stress : Not at all  Social Connections:   . Frequency of Communication with Friends and Family: Not on file  . Frequency of Social Gatherings with Friends and Family: Not on file  . Attends Religious Services: Not on file  . Active Member of Clubs or Organizations: Not on file  . Attends Archivist Meetings: Not on file  . Marital Status: Not on file  Intimate Partner Violence: Not At Risk  . Fear of Current or Ex-Partner: No   . Emotionally Abused: No  . Physically Abused: No  . Sexually Abused: No    Past Surgical History:  Procedure Laterality Date  . BREAST BIOPSY Right 1990   neg  . CARPAL TUNNEL RELEASE Bilateral   . LOWER EXTREMITY ANGIOGRAPHY Left 05/14/2020   Procedure: LOWER EXTREMITY ANGIOGRAPHY;  Surgeon: Algernon Huxley, MD;  Location: [facility];  Service: Cardiovascular;  Laterality: Left;    Family History  Problem Relation Age of Onset  . Lung cancer Mother   . Hypertension Mother   . Throat cancer Father   . Diabetes Neg Hx   . Stroke Neg Hx   . Breast cancer Neg Hx     No Known Allergies     Assessment & Plan:   1. PAD (peripheral artery disease) (HCC)  Recommend:  The patient has evidence of atherosclerosis of the lower extremities with claudication.  The patient does not voice lifestyle limiting changes at this point in time.  Noninvasive studies do not suggest clinically significant change.  No invasive studies, angiography or surgery at this time The patient should continue walking and begin a more formal exercise program.  The patient should continue antiplatelet therapy and aggressive treatment of the lipid abnormalities  No changes in the patient's medications at this time  The patient should continue wearing graduated compression socks 10-15 mmHg strength to control the mild edema.   The patient will return in 3 months with noninvasive studies sooner if issues should arise. 2. Essential hypertension Continue antihypertensive medications as already ordered, these medications have been reviewed and there are no changes at this time.    Current Outpatient Medications on File Prior to Visit  Medication Sig Dispense Refill  . acetaminophen (TYLENOL) 500 MG tablet Take 500 mg by mouth every 12 (twelve) hours as needed.    Marland Kitchen amLODipine (NORVASC) 2.5 MG tablet Take 1 tablet (2.5 mg total) by mouth daily. 90 tablet 3  . ascorbic acid (VITAMIN C) 1000 MG tablet  Take by mouth.    Marland Kitchen aspirin EC 81 MG tablet Take 1 tablet (81 mg total) by mouth daily. 150 tablet 2  . atorvastatin (LIPITOR) 10 MG tablet Take 1 tablet (10 mg total) by mouth daily. 30 tablet 11  . calcium-vitamin D (OSCAL WITH D) 500-200 MG-UNIT tablet Take 1 tablet by mouth 2 (two) times daily.    . clopidogrel (PLAVIX) 75 MG tablet Take 1 tablet (75 mg total) by mouth daily. 30 tablet 11  . predniSONE (DELTASONE) 5 MG tablet Take 5 mg by mouth daily with breakfast. (Patient not taking: Reported on 05/01/2020)    .  Turmeric (CURCUMIN 95) 500 MG CAPS Take by mouth. (Patient not taking: Reported on 05/23/2020)     No current facility-administered medications on file prior to visit.    There are no Patient Instructions on file for this visit. No follow-ups on file.   Kris Hartmann, NP

## 2020-05-25 ENCOUNTER — Other Ambulatory Visit: Payer: Self-pay

## 2020-05-25 ENCOUNTER — Ambulatory Visit (INDEPENDENT_AMBULATORY_CARE_PROVIDER_SITE_OTHER): Payer: Medicare HMO | Admitting: Family

## 2020-05-25 ENCOUNTER — Telehealth: Payer: Self-pay | Admitting: Family

## 2020-05-25 ENCOUNTER — Encounter: Payer: Self-pay | Admitting: Family

## 2020-05-25 VITALS — BP 132/64 | HR 85 | Temp 97.7°F | Ht 62.0 in | Wt 131.4 lb

## 2020-05-25 DIAGNOSIS — I10 Essential (primary) hypertension: Secondary | ICD-10-CM

## 2020-05-25 DIAGNOSIS — I700 Atherosclerosis of aorta: Secondary | ICD-10-CM

## 2020-05-25 DIAGNOSIS — I739 Peripheral vascular disease, unspecified: Secondary | ICD-10-CM | POA: Diagnosis not present

## 2020-05-25 DIAGNOSIS — M8580 Other specified disorders of bone density and structure, unspecified site: Secondary | ICD-10-CM | POA: Diagnosis not present

## 2020-05-25 DIAGNOSIS — Z87891 Personal history of nicotine dependence: Secondary | ICD-10-CM | POA: Diagnosis not present

## 2020-05-25 NOTE — Patient Instructions (Addendum)
Check with insurance about Vit D, CBC, CMP, Lipid panel and screen for abdominal aneurysm ( called AAA screen) .  Ice , ace wrap and elevation for you left leg should help with swelling, pain.    Nice to see you!

## 2020-05-25 NOTE — Telephone Encounter (Signed)
err

## 2020-05-25 NOTE — Assessment & Plan Note (Signed)
Pain, swelling improving. Advised continued close vigilance in regards to swelling and pain; she will continue to follow with vascular as well. Advised elevation, ace wrap. She will let me know of any concerns.

## 2020-05-25 NOTE — Assessment & Plan Note (Signed)
Stable, pending lipid panel. Continue lipitor.

## 2020-05-25 NOTE — Progress Notes (Signed)
Subjective:    Patient ID: Lorraine Sims, female    DOB: March 24, 1935, 85 y.o.   MRN: [id number]  CC: Lorraine Sims is a 85 y.o. female who presents today for follow up.   HPI: Has had left leg pain and swelling after left angiogram/stent  2 weeks ago,  Pain, bruising, and swelling have continued to improve and she feels best she has felt today.Swelling near resolves with elevation. Starting to feel better and back to normal. Heaviness in left leg has resolved. No low back pain. Has been being stretches at home for low back which are helpful. Wounds have healed.  No erythema of left leg, increased warmth of left leg, fever, cp, sob.   Moving to [facility].     HTN- compliant with amlodipine 2.5mg  .   HLD- compliant with lipitor 10mg  for 2 weeks.  No h/o GIB.   Recent angiogram with a stent 05/14/20  for PAD with Lorraine Sims; followed with Lorraine Sims 2 days ago . Compliant with asa 81mg , plavix 75mg . ABIs done 2 days ago and left leg has improved to 1.07 from 0.88  Atherosclerosis and osteopenia seen XR lumbar 05/2020  Following with endocrine for reclast infusion.  HISTORY:  Past Medical History:  Diagnosis Date  . Calcification of abdominal aorta (HCC)   . History of shingles   . Hypertension    pt denies  . PMR (polymyalgia rheumatica) (HCC)   . 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 BREAST BIOPSY Right 1990   neg  . CARPAL TUNNEL RELEASE Bilateral   . LOWER EXTREMITY ANGIOGRAPHY Left 05/14/2020   Procedure: LOWER EXTREMITY ANGIOGRAPHY;  Surgeon: Lorraine Huxley, MD;  Location: [facility];  Service: Cardiovascular;  Laterality: Left;   Family History  Problem Relation Age of Onset  . Lung cancer Mother   . Hypertension Mother   . Throat cancer Father   . Diabetes Neg Hx   . Stroke Neg Hx   . Breast cancer Neg Hx     Allergies: Patient has no known allergies. Current Outpatient Medications on File Prior  to Visit  Medication Sig Dispense Refill  . acetaminophen (TYLENOL) 500 MG tablet Take 500 mg by mouth every 12 (twelve) hours as needed.    Marland Kitchen amLODipine (NORVASC) 2.5 MG tablet Take 1 tablet (2.5 mg total) by mouth daily. 90 tablet 3  . ascorbic acid (VITAMIN C) 1000 MG tablet Take by mouth.    Marland Kitchen aspirin EC 81 MG tablet Take 1 tablet (81 mg total) by mouth daily. 150 tablet 2  . atorvastatin (LIPITOR) 10 MG tablet Take 1 tablet (10 mg total) by mouth daily. 30 tablet 11  . calcium-vitamin D (OSCAL WITH D) 500-200 MG-UNIT tablet Take 1 tablet by mouth 2 (two) times daily.    . clopidogrel (PLAVIX) 75 MG tablet Take 1 tablet (75 mg total) by mouth daily. 30 tablet 11   No current facility-administered medications on file prior to visit.    Social History   Tobacco Use  . Smoking status: Former Research scientist (life sciences)  . Smokeless tobacco: Never Used  . Tobacco comment: quit 30 years ago  Substance Use Topics  . Alcohol use: Yes    Comment: daily--wine  . Drug use: No    Review of Systems  Constitutional: Negative for chills and fever.  Respiratory: Negative for cough and shortness of breath.   Cardiovascular: Positive for leg  swelling (left). Negative for chest pain and palpitations.  Gastrointestinal: Negative for nausea and vomiting.  Musculoskeletal: Negative for back pain (resolved) and myalgias.  Skin: Negative for wound.  Neurological: Negative for numbness.      Objective:    BP 132/64   Pulse 85   Temp 97.7 F (36.5 C)   Ht 5\' 2"  (1.575 m)   Wt 131 lb 6.4 oz (59.6 kg)   SpO2 98%   BMI 24.03 kg/m  BP Readings from Last 3 Encounters:  05/25/20 132/64  05/23/20 (!) 150/72  05/14/20 (!) 150/64   Wt Readings from Last 3 Encounters:  05/25/20 131 lb 6.4 oz (59.6 kg)  05/23/20 131 lb (59.4 kg)  05/14/20 130 lb (59 kg)    Physical Exam Vitals reviewed.  Constitutional:      Appearance: She is well-developed.  Eyes:     Conjunctiva/sclera: Conjunctivae normal.   Cardiovascular:     Rate and Rhythm: Normal rate and regular rhythm.     Pulses: Normal pulses.     Heart sounds: Normal heart sounds.     Comments: Non pitting pedal edema. Bruising noted ventral aspect left foot.  No erythema or increased warmth. Varicosities noted.  Pulmonary:     Effort: Pulmonary effort is normal.     Breath sounds: Normal breath sounds. No wheezing, rhonchi or rales.  Musculoskeletal:     Left lower leg: Edema present.  Skin:    General: Skin is warm and dry.  Neurological:     Mental Status: She is alert.  Psychiatric:        Speech: Speech normal.        Behavior: Behavior normal.        Thought Content: Thought content normal.        Assessment & Plan:   Problem List Items Addressed This Visit      Cardiovascular and Mediastinum   Atherosclerosis of aorta (HCC)    Stable, pending lipid panel. Continue lipitor.       Relevant Orders   Comprehensive metabolic panel   Lipid panel   Essential hypertension   Relevant Orders   Comprehensive metabolic panel   Hypertension    Stable. Continue regimen      Relevant Orders   Comprehensive metabolic panel   PAD (peripheral artery disease) (HCC)    Pain, swelling improving. Advised continued close vigilance in regards to swelling and pain; she will continue to follow with vascular as well. Advised elevation, ace wrap. She will let me know of any concerns.         Musculoskeletal and Integument   Osteopenia of the elderly   Relevant Orders   VITAMIN D 25 Hydroxy (Vit-D Deficiency, Fractures)    Other Visit Diagnoses    Former smoker    -  Primary   Relevant Orders   US AORTA       I have discontinued Lorraine Sims's predniSONE and Turmeric. I am also having her maintain her calcium-vitamin D, acetaminophen, ascorbic acid, amLODipine, aspirin EC, clopidogrel, and atorvastatin.   No orders of the defined types were placed in this encounter.   Return precautions given.   Risks,  benefits, and alternatives of the medications and treatment plan prescribed today were discussed, and patient expressed understanding.   Education regarding symptom management and diagnosis given to patient on AVS.  Continue to follow with Burnard Hawthorne, FNP for routine health maintenance.   Lorraine Sims and I agreed with plan.   Joycelyn Schmid  Vidal Schwalbe, FNP

## 2020-05-25 NOTE — Assessment & Plan Note (Signed)
Stable. Continue regimen. 

## 2020-05-28 ENCOUNTER — Ambulatory Visit (INDEPENDENT_AMBULATORY_CARE_PROVIDER_SITE_OTHER): Payer: Medicare HMO

## 2020-05-28 ENCOUNTER — Other Ambulatory Visit (INDEPENDENT_AMBULATORY_CARE_PROVIDER_SITE_OTHER): Payer: Self-pay | Admitting: Nurse Practitioner

## 2020-05-28 VITALS — Ht 62.0 in | Wt 131.0 lb

## 2020-05-28 DIAGNOSIS — Z0000 Encounter for general adult medical examination without abnormal findings: Secondary | ICD-10-CM

## 2020-05-28 NOTE — Progress Notes (Addendum)
Subjective:   Lorraine Sims is a 85 y.o. female who presents for Medicare Annual (Subsequent) preventive examination.  Review of Systems    No ROS.  Medicare Wellness Virtual Visit.    Cardiac Risk Factors include: advanced age (>54men, >30 women);hypertension     Objective:    Today's Vitals   05/28/20 0838  Weight: 131 lb (59.4 kg)  Height: 5\' 2"  (1.575 m)   Body mass index is 23.96 kg/m.  Advanced Directives 05/28/2020 05/14/2020 05/26/2019 05/07/2018  Does Patient Have a Medical Advance Directive? Yes Yes Yes Yes  Type of Paramedic of [place];Living will - [place];Living will [place];Living will  Does patient want to make changes to medical advance directive? No - Patient declined No - Patient declined No - Patient declined -  Copy of [place] in Chart? No - copy requested - No - copy requested -    Current Medications (verified) Outpatient Encounter Medications as of 05/28/2020  Medication Sig  . acetaminophen (TYLENOL) 500 MG tablet Take 500 mg by mouth every 12 (twelve) hours as needed.  Marland Kitchen amLODipine (NORVASC) 2.5 MG tablet Take 1 tablet (2.5 mg total) by mouth daily.  Marland Kitchen ascorbic acid (VITAMIN C) 1000 MG tablet Take by mouth.  Marland Kitchen aspirin EC 81 MG tablet Take 1 tablet (81 mg total) by mouth daily.  Marland Kitchen atorvastatin (LIPITOR) 10 MG tablet Take 1 tablet (10 mg total) by mouth daily.  . calcium-vitamin D (OSCAL WITH D) 500-200 MG-UNIT tablet Take 1 tablet by mouth 2 (two) times daily.  . clopidogrel (PLAVIX) 75 MG tablet Take 1 tablet (75 mg total) by mouth daily.   No facility-administered encounter medications on file as of 05/28/2020.    Allergies (verified) Patient has no known allergies.   History: Past Medical History:  Diagnosis Date  . Calcification of abdominal aorta (HCC)   . History of shingles   . Hypertension    pt denies  . PMR (polymyalgia rheumatica) (HCC)   .  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 BREAST BIOPSY Right 1990   neg  . CARPAL TUNNEL RELEASE Bilateral   . LOWER EXTREMITY ANGIOGRAPHY Left 05/14/2020   Procedure: LOWER EXTREMITY ANGIOGRAPHY;  Surgeon: Algernon Huxley, MD;  Location: [facility];  Service: Cardiovascular;  Laterality: Left;   Family History  Problem Relation Age of Onset  . Lung cancer Mother   . Hypertension Mother   . Throat cancer Father   . Diabetes Neg Hx   . Stroke Neg Hx   . Breast cancer Neg Hx    Social History   Socioeconomic History  . Marital status: Married    Spouse name: Not on file  . Number of children: Not on file  . Years of education: Not on file  . Highest education level: Not on file  Occupational History  . Not on file  Tobacco Use  . Smoking status: Former Research scientist (life sciences)  . Smokeless tobacco: Never Used  . Tobacco comment: quit 30 years ago  Substance and Sexual Activity  . Alcohol use: Yes    Comment: daily--wine  . Drug use: No  . Sexual activity: Yes  Other Topics Concern  . Not on file  Social History Narrative  . Not on file   Social Determinants of Health   Financial Resource Strain: Low Risk   . Difficulty of Paying  Living Expenses: Not hard at all  Food Insecurity: No Food Insecurity  . Worried About Charity fundraiser in the Last Year: Never true  . Ran Out of Food in the Last Year: Never true  Transportation Needs: No Transportation Needs  . Lack of Transportation (Medical): No  . Lack of Transportation (Non-Medical): No  Physical Activity:   . Days of Exercise per Week: Not on file  . Minutes of Exercise per Session: Not on file  Stress: No Stress Concern Present  . Feeling of Stress : Not at all  Social Connections: Unknown  . Frequency of Communication with Friends and Family: Not on file  . Frequency of Social Gatherings with Friends and Family: Not on file  . Attends Religious Services: Not on  file  . Active Member of Clubs or Organizations: Not on file  . Attends Archivist Meetings: Not on file  . Marital Status: Married    Tobacco Counseling Counseling given: Not Answered Comment: quit 30 years ago   Clinical Intake:  Pre-visit preparation completed: Yes        Diabetes: No  How often do you need to have someone help you when you read instructions, pamphlets, or other written materials from your doctor or pharmacy?: 1 - Never       Activities of Daily Living In your present state of health, do you have any difficulty performing the following activities: 05/28/2020 05/14/2020  Hearing? N N  Vision? N N  Difficulty concentrating or making decisions? N N  Walking or climbing stairs? Y Y  Comment Paces herself -  Dressing or bathing? N N  Doing errands, shopping? N -  Preparing Food and eating ? N -  Using the Toilet? N -  In the past six months, have you accidently leaked urine? N -  Do you have problems with loss of bowel control? N -  Managing your Medications? N -  Managing your Finances? N -  Housekeeping or managing your Housekeeping? N -  Some recent data might be hidden    Patient Care Team: Burnard Hawthorne, FNP as PCP - General (Family Medicine)  Indicate any recent Medical Services you may have received from other than [facility] providers in the past year (date may be approximate).     Assessment:   This is a routine wellness examination for Lorraine Sims.  I connected with Lorraine Sims today by telephone and verified that I am speaking with the correct person using two identifiers. Location patient: home Location provider: work Persons participating in the virtual visit: patient, Marine scientist.    I discussed the limitations, risks, security and privacy concerns of performing an evaluation and management service by telephone and the availability of in person appointments. The patient expressed understanding and verbally consented to this telephonic  visit.    Interactive audio and video telecommunications were attempted between this provider and patient, however failed, due to patient having technical difficulties OR patient did not have access to video capability.  We continued and completed visit with audio only.  Some vital signs may be absent or patient reported.   Hearing/Vision screen No exam data present  Dietary issues and exercise activities discussed: Current Exercise Habits: Home exercise routine, Type of exercise: stretching;strength training/weights, Intensity: Mild  Goals      Patient Stated   .  Follow up with Primary Care Provider (pt-stated)      As needed      Depression Screen PHQ 2/9 Scores 05/28/2020  05/25/2020 04/04/2020 05/26/2019 03/18/2019 01/06/2018  PHQ - 2 Score 0 0 0 0 0 0  PHQ- 9 Score 0 1 - - - -    Fall Risk Fall Risk  05/28/2020 04/04/2020 05/26/2019 01/06/2018  Falls in the past year? 0 0 0 Yes  Number falls in past yr: 0 0 - 1  Injury with Fall? - 0 - No  Follow up Falls evaluation completed Falls evaluation completed - -   Handrails in use when climbing stairs? Yes Home free of loose throw rugs in walkways, pet beds, electrical cords, etc? Yes  Adequate lighting in your home to reduce risk of falls? Yes   ASSISTIVE DEVICES UTILIZED TO PREVENT FALLS: Use of a cane, walker or w/c? No   TIMED UP AND GO: Was the test performed? Yes . Virtual visit.   Cognitive Function:   Patient is alert and oriented x3.  Denies difficulty focusing, making decisions, memory delay.  Enjoys brain challenging activities like sodoku. Marland Kitchen    6CIT Screen 05/26/2019  What Year? 0 points  What month? 0 points  What time? 0 points  Count back from 20 0 points  Months in reverse 0 points  Repeat phrase 0 points  Total Score 0   Immunizations Immunization History  Administered Date(s) Administered  . Influenza, High Dose Seasonal PF 05/26/2018  . Influenza-Unspecified 05/01/2020  . PFIZER SARS-COV-2 Vaccination  09/23/2019, 10/11/2019   Health Maintenance Health Maintenance  Topic Date Due  . PNA vac Low Risk Adult (1 of 2 - PCV13) Never done  . TETANUS/TDAP  05/28/2021 (Originally 05/24/1954)  . INFLUENZA VACCINE  Completed  . DEXA SCAN  Completed  . COVID-19 Vaccine  Completed   Dental Screening: Recommended annual dental exams for proper oral hygiene. Visit every 6 months.   Dexa Scan- see care everywhere. [facility]. Last recorded date 03/21/19.  Community Resource Referral / Chronic Care Management: CRR required this visit?  No   CCM required this visit?  No      Plan:   Keep all routine maintenance appointments.   Follow up 05/29/20 @ 9:00  I have personally reviewed and noted the following in the patient's chart:   . Medical and social history . Use of alcohol, tobacco or illicit drugs  . Current medications and supplements . Functional ability and status . Nutritional status . Physical activity . Advanced directives . List of other physicians . Hospitalizations, surgeries, and ER visits in previous 12 months . Vitals . Screenings to include cognitive, depression, and falls . Referrals and appointments  In addition, I have reviewed and discussed with patient certain preventive protocols, quality metrics, and best practice recommendations. A written personalized care plan for preventive services as well as general preventive health recommendations were provided to patient via mychart.      Varney Biles, LPN   [id number]     Agree with plan. Mable Paris, NP

## 2020-05-28 NOTE — Patient Instructions (Addendum)
Lorraine Sims , Thank you for taking time to come for your Medicare Wellness Visit. I appreciate your ongoing commitment to your health goals. Please review the following plan we discussed and let me know if I can assist you in the future.   These are the goals we discussed: Goals      Patient Stated   .  Follow up with Primary Care Provider (pt-stated)      As needed       This is a list of the screening recommended for you and due dates:  Health Maintenance  Topic Date Due  . Pneumonia vaccines (1 of 2 - PCV13) Never done  . Tetanus Vaccine  05/28/2021*  . Flu Shot  Completed  . DEXA scan (bone density measurement)  Completed  . COVID-19 Vaccine  Completed  *Topic was postponed. The date shown is not the original due date.    Immunizations Immunization History  Administered Date(s) Administered  . Influenza, High Dose Seasonal PF 05/26/2018  . Influenza-Unspecified 05/01/2020  . PFIZER SARS-COV-2 Vaccination 09/23/2019, 10/11/2019   Keep all routine maintenance appointments.   Follow up 05/29/20 @ 9:00  Advanced directives: End of life planning; Advance aging; Advanced directives discussed.  Copy of current HCPOA/Living Will requested.    Conditions/risks identified: none new.   Follow up in one year for your annual wellness visit.   Preventive Care 22 Years and Older, Female Preventive care refers to lifestyle choices and visits with your health care provider that can promote health and wellness. What does preventive care include?  A yearly physical exam. This is also called an annual well check.  Dental exams once or twice a year.  Routine eye exams. Ask your health care provider how often you should have your eyes checked.  Personal lifestyle choices, including:  Daily care of your teeth and gums.  Regular physical activity.  Eating a healthy diet.  Avoiding tobacco and drug use.  Limiting alcohol use.  Practicing safe sex.  Taking low-dose aspirin  every day.  Taking vitamin and mineral supplements as recommended by your health care provider. What happens during an annual well check? The services and screenings done by your health care provider during your annual well check will depend on your age, overall health, lifestyle risk factors, and family history of disease. Counseling  Your health care provider may ask you questions about your:  Alcohol use.  Tobacco use.  Drug use.  Emotional well-being.  Home and relationship well-being.  Sexual activity.  Eating habits.  History of falls.  Memory and ability to understand (cognition).  Work and work Statistician.  Reproductive health. Screening  You may have the following tests or measurements:  Height, weight, and BMI.  Blood pressure.  Lipid and cholesterol levels. These may be checked every 5 years, or more frequently if you are over 64 years old.  Skin check.  Lung cancer screening. You may have this screening every year starting at age 59 if you have a 30-pack-year history of smoking and currently smoke or have quit within the past 15 years.  Fecal occult blood test (FOBT) of the stool. You may have this test every year starting at age 62.  Flexible sigmoidoscopy or colonoscopy. You may have a sigmoidoscopy every 5 years or a colonoscopy every 10 years starting at age 81.  Hepatitis C blood test.  Hepatitis B blood test.  Sexually transmitted disease (STD) testing.  Diabetes screening. This is done by checking your blood sugar (  glucose) after you have not eaten for a while (fasting). You may have this done every 1-3 years.  Bone density scan. This is done to screen for osteoporosis. You may have this done starting at age 46.  Mammogram. This may be done every 1-2 years. Talk to your health care provider about how often you should have regular mammograms. Talk with your health care provider about your test results, treatment options, and if necessary,  the need for more tests. Vaccines  Your health care provider may recommend certain vaccines, such as:  Influenza vaccine. This is recommended every year.  Tetanus, diphtheria, and acellular pertussis (Tdap, Td) vaccine. You may need a Td booster every 10 years.  Zoster vaccine. You may need this after age 57.  Pneumococcal 13-valent conjugate (PCV13) vaccine. One dose is recommended after age 17.  Pneumococcal polysaccharide (PPSV23) vaccine. One dose is recommended after age 70. Talk to your health care provider about which screenings and vaccines you need and how often you need them. This information is not intended to replace advice given to you by your health care provider. Make sure you discuss any questions you have with your health care provider. Document Released: 09/14/2015 Document Revised: 05/07/2016 Document Reviewed: 06/19/2015 Elsevier Interactive Patient Education  2017 [place] Prevention in the Home Falls can cause injuries. They can happen to people of all ages. There are many things you can do to make your home safe and to help prevent falls. What can I do on the outside of my home?  Regularly fix the edges of walkways and driveways and fix any cracks.  Remove anything that might make you trip as you walk through a door, such as a raised step or threshold.  Trim any bushes or trees on the path to your home.  Use bright outdoor lighting.  Clear any walking paths of anything that might make someone trip, such as rocks or tools.  Regularly check to see if handrails are loose or broken. Make sure that both sides of any steps have handrails.  Any raised decks and porches should have guardrails on the edges.  Have any leaves, snow, or ice cleared regularly.  Use sand or salt on walking paths during winter.  Clean up any spills in your garage right away. This includes oil or grease spills. What can I do in the bathroom?  Use night lights.  Install  grab bars by the toilet and in the tub and shower. Do not use towel bars as grab bars.  Use non-skid mats or decals in the tub or shower.  If you need to sit down in the shower, use a plastic, non-slip stool.  Keep the floor dry. Clean up any water that spills on the floor as soon as it happens.  Remove soap buildup in the tub or shower regularly.  Attach bath mats securely with double-sided non-slip rug tape.  Do not have throw rugs and other things on the floor that can make you trip. What can I do in the bedroom?  Use night lights.  Make sure that you have a light by your bed that is easy to reach.  Do not use any sheets or blankets that are too big for your bed. They should not hang down onto the floor.  Have a firm chair that has side arms. You can use this for support while you get dressed.  Do not have throw rugs and other things on the floor that can make  you trip. What can I do in the kitchen?  Clean up any spills right away.  Avoid walking on wet floors.  Keep items that you use a lot in easy-to-reach places.  If you need to reach something above you, use a strong step stool that has a grab bar.  Keep electrical cords out of the way.  Do not use floor polish or wax that makes floors slippery. If you must use wax, use non-skid floor wax.  Do not have throw rugs and other things on the floor that can make you trip. What can I do with my stairs?  Do not leave any items on the stairs.  Make sure that there are handrails on both sides of the stairs and use them. Fix handrails that are broken or loose. Make sure that handrails are as long as the stairways.  Check any carpeting to make sure that it is firmly attached to the stairs. Fix any carpet that is loose or worn.  Avoid having throw rugs at the top or bottom of the stairs. If you do have throw rugs, attach them to the floor with carpet tape.  Make sure that you have a light switch at the top of the stairs and  the bottom of the stairs. If you do not have them, ask someone to add them for you. What else can I do to help prevent falls?  Wear shoes that:  Do not have high heels.  Have rubber bottoms.  Are comfortable and fit you well.  Are closed at the toe. Do not wear sandals.  If you use a stepladder:  Make sure that it is fully opened. Do not climb a closed stepladder.  Make sure that both sides of the stepladder are locked into place.  Ask someone to hold it for you, if possible.  Clearly mark and make sure that you can see:  Any grab bars or handrails.  First and last steps.  Where the edge of each step is.  Use tools that help you move around (mobility aids) if they are needed. These include:  Canes.  Walkers.  Scooters.  Crutches.  Turn on the lights when you go into a dark area. Replace any light bulbs as soon as they burn out.  Set up your furniture so you have a clear path. Avoid moving your furniture around.  If any of your floors are uneven, fix them.  If there are any pets around you, be aware of where they are.  Review your medicines with your doctor. Some medicines can make you feel dizzy. This can increase your chance of falling. Ask your doctor what other things that you can do to help prevent falls. This information is not intended to replace advice given to you by your health care provider. Make sure you discuss any questions you have with your health care provider. Document Released: 06/14/2009 Document Revised: 01/24/2016 Document Reviewed: 09/22/2014 Elsevier Interactive Patient Education  2017 [employer].

## 2020-05-28 NOTE — Telephone Encounter (Signed)
The pt left a VM on the nurses line she want a Refill on tramadol. Her pharmacy has cent over a request . The pt had an angio done by dew a few week ago is this ok to refill?

## 2020-05-29 ENCOUNTER — Other Ambulatory Visit: Payer: Medicare HMO

## 2020-05-31 ENCOUNTER — Ambulatory Visit: Payer: Medicare HMO

## 2020-07-20 DIAGNOSIS — M5416 Radiculopathy, lumbar region: Secondary | ICD-10-CM | POA: Insufficient documentation

## 2020-08-13 ENCOUNTER — Other Ambulatory Visit (INDEPENDENT_AMBULATORY_CARE_PROVIDER_SITE_OTHER): Payer: Self-pay | Admitting: Nurse Practitioner

## 2020-08-13 DIAGNOSIS — I739 Peripheral vascular disease, unspecified: Secondary | ICD-10-CM

## 2020-08-15 ENCOUNTER — Ambulatory Visit (INDEPENDENT_AMBULATORY_CARE_PROVIDER_SITE_OTHER): Payer: Medicare HMO | Admitting: Nurse Practitioner

## 2020-08-15 ENCOUNTER — Other Ambulatory Visit: Payer: Self-pay

## 2020-08-15 ENCOUNTER — Ambulatory Visit (INDEPENDENT_AMBULATORY_CARE_PROVIDER_SITE_OTHER): Payer: Medicare HMO

## 2020-08-15 ENCOUNTER — Encounter (INDEPENDENT_AMBULATORY_CARE_PROVIDER_SITE_OTHER): Payer: Self-pay | Admitting: Nurse Practitioner

## 2020-08-15 VITALS — BP 183/78 | HR 86 | Resp 16 | Wt 128.8 lb

## 2020-08-15 DIAGNOSIS — I739 Peripheral vascular disease, unspecified: Secondary | ICD-10-CM

## 2020-08-15 DIAGNOSIS — I10 Essential (primary) hypertension: Secondary | ICD-10-CM

## 2020-08-26 ENCOUNTER — Encounter (INDEPENDENT_AMBULATORY_CARE_PROVIDER_SITE_OTHER): Payer: Self-pay | Admitting: Nurse Practitioner

## 2020-08-26 NOTE — Progress Notes (Signed)
Subjective:    Patient ID: Lorraine Sims, female    DOB: [birth year], 84 y.o.   MRN: [id number] Chief Complaint  Patient presents with  . Follow-up    3 month abi    The patient returns to the office for followup and review of the noninvasive studies. There have been no interval changes in lower extremity symptoms. No interval shortening of the patient's claudication distance or development of rest pain symptoms. No new ulcers or wounds have occurred since the last visit.  There have been no significant changes to the patient's overall health care.  The patient denies amaurosis fugax or recent TIA symptoms. There are no recent neurological changes noted. The patient denies history of DVT, PE or superficial thrombophlebitis. The patient denies recent episodes of angina or shortness of breath.   ABI Rt=0.72 and Lt=0.94  (previous ABI's Rt=1.02 and Lt=1.07) Duplex ultrasound of the bilateral triphasic tibial arteries with good toe waveforms bilaterally.   Review of Systems  Cardiovascular: Negative for leg swelling.  All other systems reviewed and are negative.      Objective:   Physical Exam Vitals reviewed.  HENT:     Head: Normocephalic.  Cardiovascular:     Rate and Rhythm: Normal rate.     Pulses: Decreased pulses.  Pulmonary:     Effort: Pulmonary effort is normal.  Neurological:     Mental Status: She is alert and oriented to person, place, and time.  Psychiatric:        Mood and Affect: Mood normal.        Behavior: Behavior normal.        Thought Content: Thought content normal.        Judgment: Judgment normal.     BP (!) 183/78 (BP Location: Left Arm)   Pulse 86   Resp 16   Wt 128 lb 12.8 oz (58.4 kg)   BMI 23.56 kg/m   Past Medical History:  Diagnosis Date  . Calcification of abdominal aorta (HCC)   . History of shingles   . Hypertension    pt denies  . PMR (polymyalgia rheumatica) (HCC)   . 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 Marital status: Married    Spouse name: Not on file  . Number of children: Not on file  . Years of education: Not on file  . Highest education level: Not on file  Occupational History  . Not on file  Tobacco Use  . Smoking status: Former Research scientist (life sciences)  . Smokeless tobacco: Never Used  . Tobacco comment: quit 30 years ago  Substance and Sexual Activity  . Alcohol use: Yes    Comment: daily--wine  . Drug use: No  . Sexual activity: Yes  Other Topics Concern  . Not on file  Social History Narrative  . Not on file   Social Determinants of Health   Financial Resource Strain: Low Risk   . Difficulty of Paying Living Expenses: Not hard at all  Food Insecurity: No Food Insecurity  . Worried About Charity fundraiser in the Last Year: Never true  . Ran Out of Food in the Last Year: Never true  Transportation Needs: No Transportation Needs  . Lack of Transportation (Medical): No  . Lack of Transportation (Non-Medical): No  Physical Activity: Not on file  Stress: No Stress Concern Present  . Feeling of Stress : Not at all  Social Connections: Unknown  .  Frequency of Communication with Friends and Family: Not on file  . Frequency of Social Gatherings with Friends and Family: Not on file  . Attends Religious Services: Not on file  . Active Member of Clubs or Organizations: Not on file  . Attends Archivist Meetings: Not on file  . Marital Status: Married  Human resources officer Violence: Not At Risk  . Fear of Current or Ex-Partner: No  . Emotionally Abused: No  . Physically Abused: No  . Sexually Abused: No    Past Surgical History:  Procedure Laterality Date  . BREAST BIOPSY Right 1990   neg  . CARPAL TUNNEL RELEASE Bilateral   . LOWER EXTREMITY ANGIOGRAPHY Left 05/14/2020   Procedure: LOWER EXTREMITY ANGIOGRAPHY;  Surgeon: Algernon Huxley, MD;  Location: [facility];  Service: Cardiovascular;  Laterality: Left;    Family History   Problem Relation Age of Onset  . Lung cancer Mother   . Hypertension Mother   . Throat cancer Father   . Diabetes Neg Hx   . Stroke Neg Hx   . Breast cancer Neg Hx     No Known Allergies  CBC Latest Ref Rng & Units 05/07/2018  WBC 3.6 - 11.0 K/uL 9.7  Hemoglobin 12.0 - 16.0 g/dL 15.4  Hematocrit 35.0 - 47.0 % 45.1  Platelets 150 - 440 K/uL 292      CMP     Component Value Date/Time   NA 141 05/07/2018 1635   K 3.7 05/07/2018 1635   CL 104 05/07/2018 1635   CO2 28 05/07/2018 1635   GLUCOSE 89 05/07/2018 1635   BUN 20 05/14/2020 1015   CREATININE 0.61 05/14/2020 1015   CALCIUM 8.8 (L) 05/07/2018 1635   GFRNONAA >60 05/14/2020 1015   GFRAA >60 05/14/2020 1015     VAS [place] ABI WITH/WO TBI  Result Date: 08/17/2020 LOWER EXTREMITY DOPPLER STUDY Indications: Claudication, rest pain, and S/P LE Stent.  Vascular Interventions: 05/14/2020: Aorta and Selective Left Lower Extremity                         Angiogram. Mechanical Thrombectomy with the roto - Rex                         device in the Left SFA. PTA of the Left SFA. Viabahn                         stent placement to the Left SFA. Comparison Study: 05/23/2020 Performing Technologist: Charlane Ferretti RT (R)(VS)  Examination Guidelines: A complete evaluation includes at minimum, Doppler waveform signals and systolic blood pressure reading at the level of bilateral brachial, anterior tibial, and posterior tibial arteries, when vessel segments are accessible. Bilateral testing is considered an integral part of a complete examination. Photoelectric Plethysmograph (PPG) waveforms and toe systolic pressure readings are included as required and additional duplex testing as needed. Limited examinations for reoccurring indications may be performed as noted.  ABI Findings: +---------+------------------+-----+---------+--------+ Right    Rt Pressure (mmHg)IndexWaveform Comment  +---------+------------------+-----+---------+--------+  Brachial 178                                      +---------+------------------+-----+---------+--------+ ATA      132               0.72  triphasic         +---------+------------------+-----+---------+--------+ PTA      128               0.70 triphasic         +---------+------------------+-----+---------+--------+ Great Toe116               0.63 Dampened          +---------+------------------+-----+---------+--------+ +---------+------------------+-----+---------+-------+ Left     Lt Pressure (mmHg)IndexWaveform Comment +---------+------------------+-----+---------+-------+ Brachial 184                                     +---------+------------------+-----+---------+-------+ ATA      173               0.94 triphasic        +---------+------------------+-----+---------+-------+ PTA      171               0.93 triphasic        +---------+------------------+-----+---------+-------+ [place]               0.64 Normal           +---------+------------------+-----+---------+-------+ +-------+-----------+-----------+------------+------------+ ABI/TBIToday's ABIToday's TBIPrevious ABIPrevious TBI +-------+-----------+-----------+------------+------------+ Right  .72        .63        1.02        .97          +-------+-----------+-----------+------------+------------+ Left   .94        .64        1.07        1.01         +-------+-----------+-----------+------------+------------+ Bilateral ABIs appear decreased compared to prior study on 05/23/2020. Bilateral TBIs appear decreased compared to prior study on 05/23/2020. Summary: Right: Resting right ankle-brachial index indicates moderate right lower extremity arterial disease. The right toe-brachial index is abnormal. Left: Resting left ankle-brachial index indicates mild left lower extremity arterial disease. The left toe-brachial index is abnormal. *See table(s) above for measurements and  observations.  Electronically signed by Leotis Pain MD on 08/17/2020 at 10:49:56 AM.   Final        Assessment & Plan:   1. PAD (peripheral artery disease) (HCC)  Recommend:  The patient has evidence of atherosclerosis of the lower extremities with claudication.  The patient does not voice lifestyle limiting changes at this point in time.  Noninvasive studies do not suggest clinically significant change.  No invasive studies, angiography or surgery at this time The patient should continue walking and begin a more formal exercise program.  The patient should continue antiplatelet therapy and aggressive treatment of the lipid abnormalities  No changes in the patient's medications at this time  The patient should continue wearing graduated compression socks 10-15 mmHg strength to control the mild edema.   [place]  [place] a referral so the patient can establish vascular care at her new residence.  Currently the patient needs no intervention.  Patient will follow up in the office on as-needed basis.  - Ambulatory referral to Vascular Surgery  2. Essential hypertension Continue antihypertensive medications as already ordered, these medications have been reviewed and there are no changes at this time.    Current Outpatient Medications on File Prior to Visit  Medication Sig Dispense Refill  . acetaminophen (TYLENOL) 500 MG tablet Take 500 mg by mouth every 12 (twelve) hours as needed.    Marland Kitchen  amLODipine (NORVASC) 2.5 MG tablet Take 1 tablet (2.5 mg total) by mouth daily. 90 tablet 3  . ascorbic acid (VITAMIN C) 1000 MG tablet Take 2,000 mg by mouth daily.    Marland Kitchen aspirin EC 81 MG tablet Take 1 tablet (81 mg total) by mouth daily. 150 tablet 2  . atorvastatin (LIPITOR) 10 MG tablet Take 1 tablet (10 mg total) by mouth daily. 30 tablet 11  . cholecalciferol (VITAMIN D3) 25 MCG (1000 UNIT) tablet Take 1,000 Units by mouth daily.    . clopidogrel (PLAVIX) 75 MG  tablet Take 1 tablet (75 mg total) by mouth daily. 30 tablet 11  . calcium-vitamin D (OSCAL WITH D) 500-200 MG-UNIT tablet Take 1 tablet by mouth 2 (two) times daily. (Patient not taking: Reported on 08/15/2020)    . traMADol (ULTRAM) 50 MG tablet TAKE 1 TABLET BY MOUTH EVERY 6 HOURS AS NEEDED (Patient not taking: Reported on 08/15/2020) 20 tablet 0   No current facility-administered medications on file prior to visit.    There are no Patient Instructions on file for this visit. No follow-ups on file.   Kris Hartmann, NP

## 2020-09-10 ENCOUNTER — Telehealth: Payer: Self-pay | Admitting: Family

## 2020-09-10 ENCOUNTER — Encounter: Payer: Self-pay | Admitting: Family

## 2020-09-10 ENCOUNTER — Other Ambulatory Visit: Payer: Self-pay | Admitting: Family

## 2020-09-10 NOTE — Telephone Encounter (Signed)
Call pt At last visit we had talked about screen for abdominal aneurysm with ultrasound of abdomen as she is former smoker  I dont see she done and wanted to ask her to call [employer] medicare to see if they would cover prior to moving forward. Recently had patient who's insurance didn't cover screen  She may also speak with vascular at follow up and see if they would recommend at her age  If insurance will cover and pt want to proceed with ultrasound please ask her to call us so I may reorder

## 2020-09-10 NOTE — Telephone Encounter (Signed)
[place]. She appreciated your care & stated that she would ask new PCP in regards to this. She apologized for not following up on this sooner, but she was in the middle of moving.

## 2021-01-23 ENCOUNTER — Telehealth: Payer: Self-pay | Admitting: Family

## 2021-01-23 NOTE — Telephone Encounter (Signed)
I left a message for patient to call back and reschedule AWV.  [place]  I cancelled appointment.

## 2021-05-29 ENCOUNTER — Ambulatory Visit: Payer: Medicare HMO

## 2021-12-16 IMAGING — DX DG LUMBAR SPINE COMPLETE 4+V
5 series · 5 of 5 positions shown · non-contrast
Comparison: None.

CLINICAL DATA: 84-year-old female with leg pain.

EXAM:
LUMBAR SPINE - COMPLETE 4+ VIEW

[lumbar spine ap]
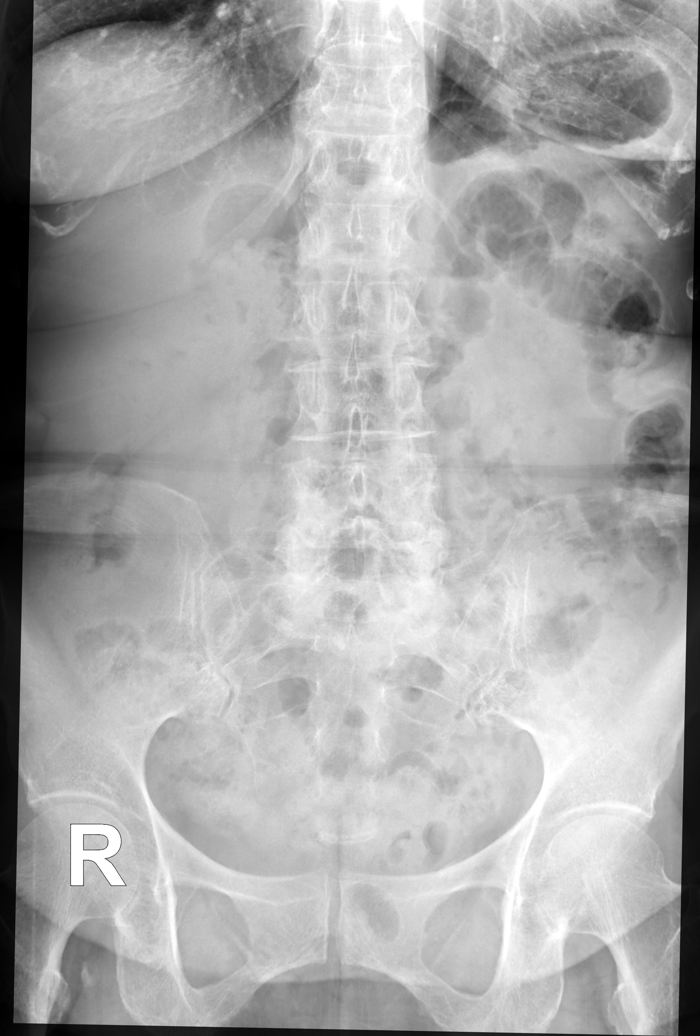

[lumbar spine obl (oblique) (1 of 2)]
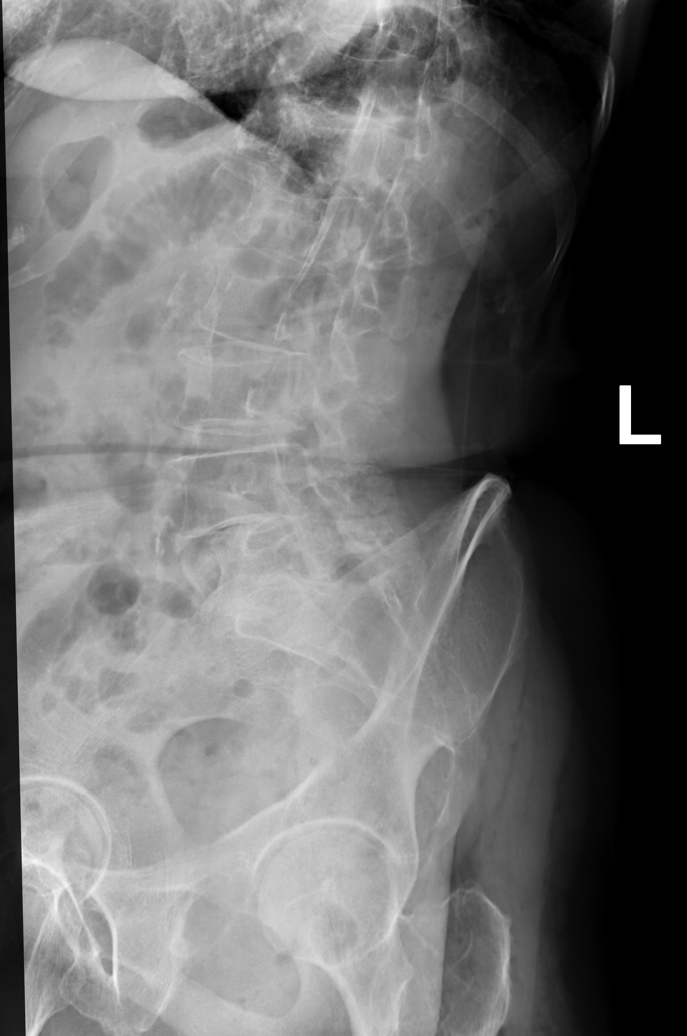

[lumbar spine obl (oblique) (2 of 2)]
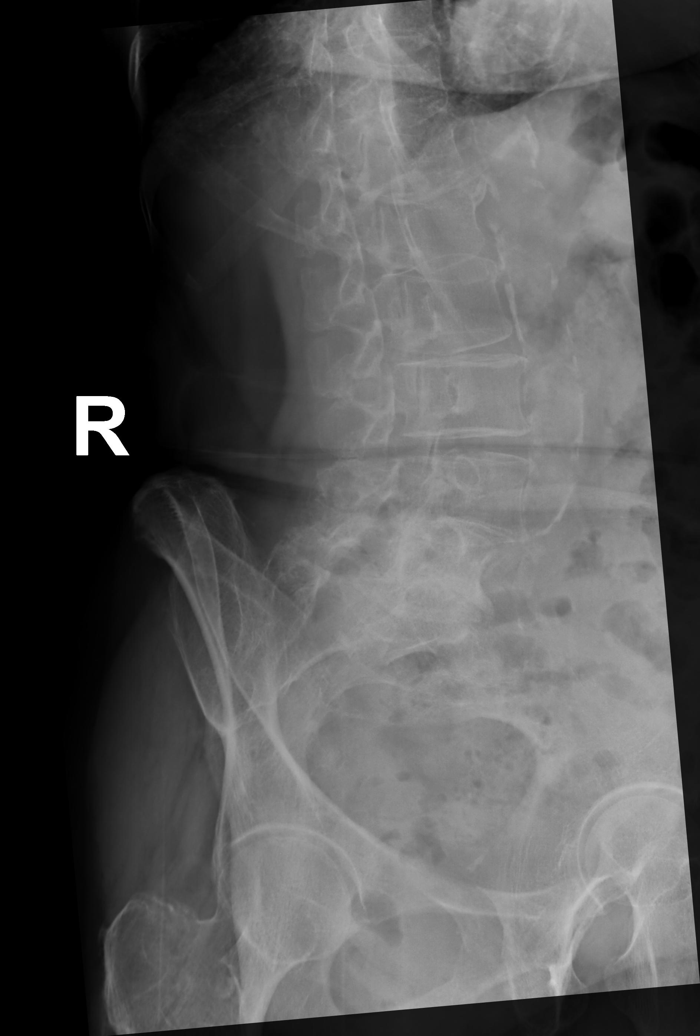

[lumbar spine lat]
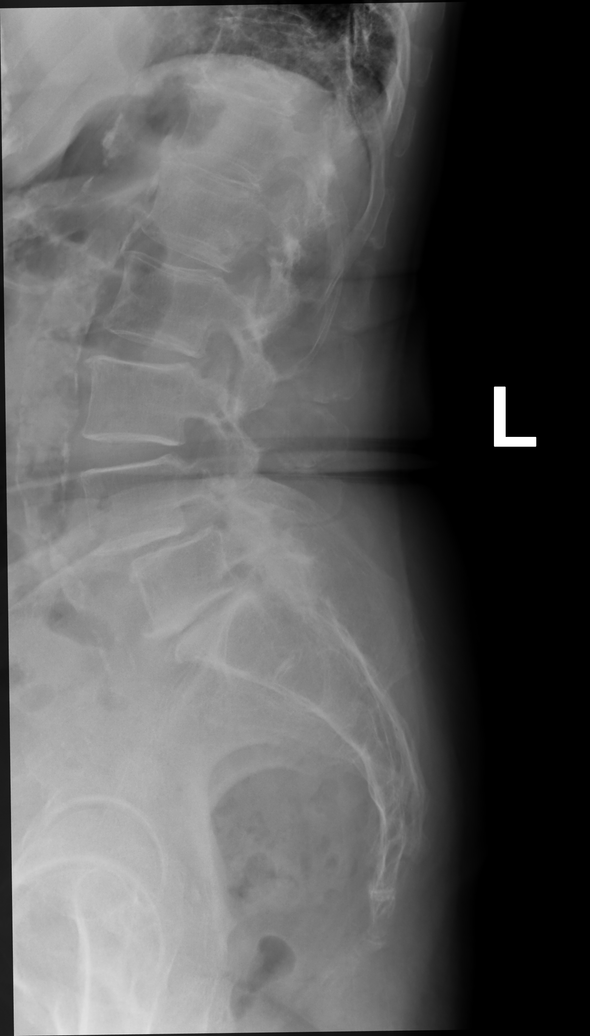

[lumbar spot lat]
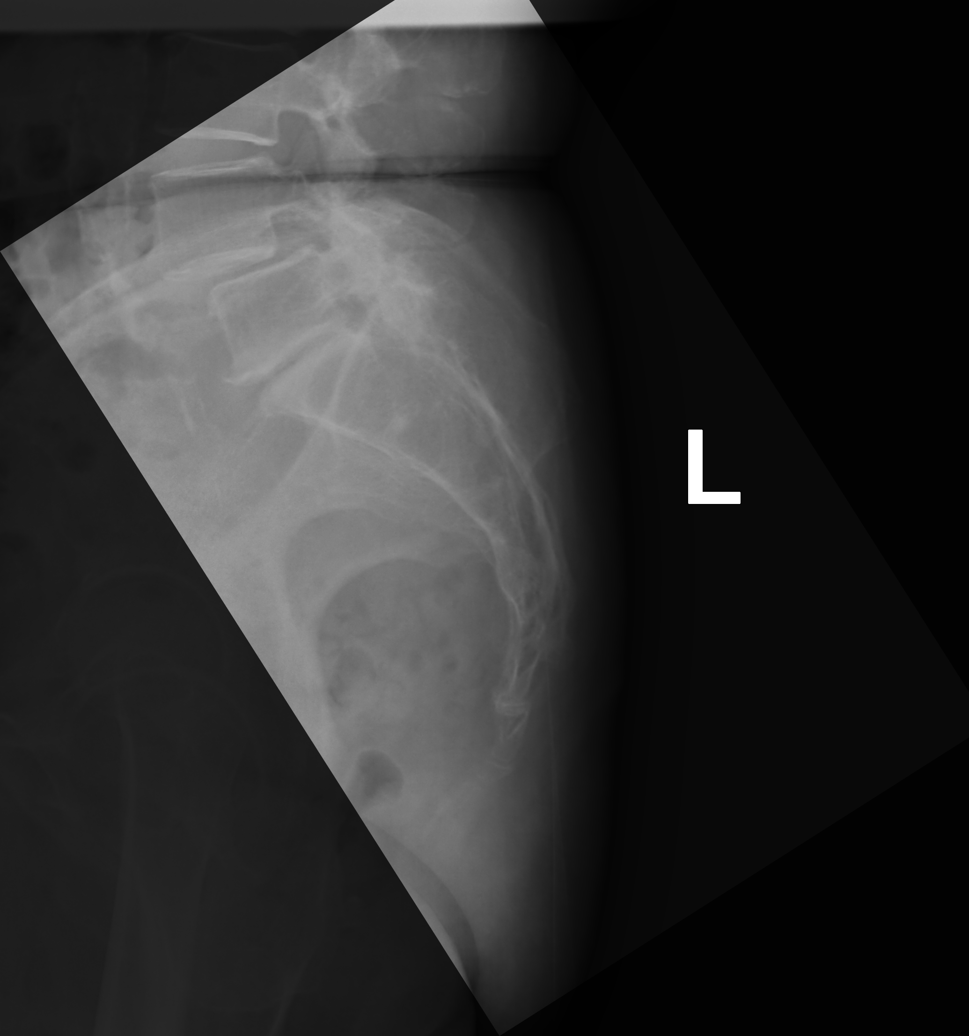

[5 of 5 positions shown; findings below may reference images not displayed]

FINDINGS: Five lumbar type vertebra. There is no acute fracture or subluxation
of the lumbar spine. The bones are osteopenic. There is grade 1
L4-L5 anterolisthesis. There is lower lumbar facet arthropathy.
Advanced atherosclerotic calcification of the abdominal aorta. The
soft tissues are unremarkable.
IMPRESSION: 1. No acute fracture or subluxation of the lumbar spine.
2. Osteopenia and degenerative changes.

## 2022-06-30 ENCOUNTER — Encounter (INDEPENDENT_AMBULATORY_CARE_PROVIDER_SITE_OTHER): Payer: Self-pay
